# Patient Record
Sex: Female | Born: 1995 | Hispanic: Yes | Smoking: Never smoker
Health system: Southern US, Community
[De-identification: ages and names within clinical notes are randomized; demographics above are authoritative.]

## PROBLEM LIST (undated history)

## (undated) DIAGNOSIS — Z789 Other specified health status: Secondary | ICD-10-CM

## (undated) DIAGNOSIS — D649 Anemia, unspecified: Secondary | ICD-10-CM

---
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
## 2015-07-16 NOTE — L&D Delivery Note (Signed)
Delivery Note  First Stage: Labor onset: 05/14/16 @1800  Augmentation : none Analgesia /Anesthesia intrapartum: none SROM for clear fluid at 0640  Second Stage: Complete dilation at 0644 Onset of pushing at 0644 FHR second stage Category 2: baseline: 145 bpm/ moderate variability/ +accels/ occasional early decel with contractions   Patient progressed quickly from 2.5cm to complete in 2 hours.  Precipitous Delivery of a viable female at [phone] by Carlean JewsMeredith Wilene Pharo, CNM in Direct OA position, anterior shoulder quickly delivered followed by body. Baby was vigorously crying and I dried her and placed her on mom's abdomen.  No nuchal cord Cord double clamped after cessation of pulsation, cut by FOB Cord blood sample collected   Third Stage: Placenta delivered via Tomasa BlaseSchultz intact with 3 VC @ [phone] Placenta disposition: hospital disposal Uterine tone firm with massage and Pitocin infusing / bleeding moderate from laceration, but slowed after repair and Pitocin  1st degree perineal with vaginal extension identified  Anesthesia for repair: 1% Lidocaine 30mL Repair: 2.0 vicryl  Est. Blood Loss (mL): 300mL  Complications: none  Mom to postpartum.  Baby to Couplet care / Skin to Skin.  Newborn: Birth Weight: pending Apgar Scores: 8, 9 Feeding planned: Formula   Carlean JewsMeredith Aleigha Gilani, CNM

## 2016-04-16 ENCOUNTER — Other Ambulatory Visit: Payer: Self-pay | Admitting: Oncology

## 2016-04-16 DIAGNOSIS — O99013 Anemia complicating pregnancy, third trimester: Principal | ICD-10-CM

## 2016-04-16 DIAGNOSIS — D509 Iron deficiency anemia, unspecified: Secondary | ICD-10-CM

## 2016-04-21 NOTE — Progress Notes (Signed)
[facility]  Telephone:(336) [phone] Fax:(336) [phone]  ID: Kerry Rhodes OB: 10/25/1995  MR#: [id number]  GEX#:[id number]  Patient Care Team: No Pcp Per Patient as PCP - General (General Practice)  CHIEF COMPLAINT: Iron deficiency anemia in the third trimester of pregnancy.  INTERVAL HISTORY: Patient is a 20 year old female as noted have a declining hemoglobin and iron stores on routine blood work. She is in her third trimester pregnancy and her due date is May 20, 2016. She feels fatigued, but otherwise feels well. She has no neurological point. She denies any recent fevers or illnesses. She is a good appetite and is gaining weight appropriately. She has no chest pain or shortness of breath. She denies any nausea, vomiting, constipation, or diarrhea. She has no urinary complaints. Patient offers no further specific complaints.   REVIEW OF SYSTEMS:   Review of Systems  Constitutional: Negative.  Negative for fever, malaise/fatigue and weight loss.  Respiratory: Negative.  Negative for cough and shortness of breath.   Cardiovascular: Negative.  Negative for chest pain and leg swelling.  Gastrointestinal: Negative.  Negative for abdominal pain.  Genitourinary: Negative.   Musculoskeletal: Negative.   Neurological: Negative.  Negative for weakness.  Psychiatric/Behavioral: Negative.  The patient is not nervous/anxious.     As per HPI. Otherwise, a complete review of systems is negative.  PAST MEDICAL HISTORY: History reviewed. No pertinent past medical history.  PAST SURGICAL HISTORY: History reviewed. No pertinent surgical history.  FAMILY HISTORY: History reviewed. No pertinent family history.  ADVANCED DIRECTIVES (Y/N):  N  HEALTH MAINTENANCE: Social History  Substance Use Topics  . Smoking status: Never Smoker  . Smokeless tobacco: Never Used  . Alcohol use Not on file     Colonoscopy:  PAP:  Bone density:  Lipid panel:  No Known  Allergies  Current Outpatient Prescriptions  Medication Sig Dispense Refill  . ferrous fumarate (HEMOCYTE - 106 MG FE) 325 (106 Fe) MG TABS tablet Take 1 tablet by mouth 3 (three) times daily.    . Prenatal Vit-Fe Fumarate-FA (PRENATAL VITAMIN PO) Take 1 tablet by mouth daily.     No current facility-administered medications for this visit.     OBJECTIVE: Vitals:   04/23/16 1104  BP: 117/77  Pulse: 74  Resp: 18  Temp: 98.5 F (36.9 C)     Body mass index is 37.1 kg/m.    ECOG FS:0 - Asymptomatic  General: Well-developed, well-nourished, no acute distress. Eyes: Pink conjunctiva, anicteric sclera. HEENT: Normocephalic, moist mucous membranes, clear oropharnyx. Lungs: Clear to auscultation bilaterally. Heart: Regular rate and rhythm. No rubs, murmurs, or gallops. Abdomen: Appears appropriate for gestational age. Musculoskeletal: No edema, cyanosis, or clubbing. Neuro: Alert, answering all questions appropriately. Cranial nerves grossly intact. Skin: No rashes or petechiae noted. Psych: Normal affect. Lymphatics: No cervical, calvicular, axillary or inguinal LAD.   LAB RESULTS:  No results found for: NA, K, CL, CO2, GLUCOSE, BUN, CREATININE, CALCIUM, PROT, ALBUMIN, AST, ALT, ALKPHOS, BILITOT, GFRNONAA, GFRAA  Lab Results  Component Value Date   WBC 8.3 04/23/2016   NEUTROABS 4.8 04/23/2016   HGB 9.7 (L) 04/23/2016   HCT 29.7 (L) 04/23/2016   MCV 71.1 (L) 04/23/2016   PLT 288 04/23/2016   Lab Results  Component Value Date   IRON 371 (H) 04/23/2016   TIBC 572 (H) 04/23/2016   IRONPCTSAT 65 (H) 04/23/2016   Lab Results  Component Value Date   FERRITIN 11 04/23/2016     STUDIES: No results found.  ASSESSMENT: Iron deficiency anemia in the third trimester of pregnancy.  PLAN:    1. Iron deficiency anemia in the third trimester of pregnancy: Patient's hemoglobin is decreased, but improved from previous lab work. Her iron stores are significantly elevated, but  is unclear if she received her iron infusion prior to having her labs drawn. No other intervention is needed at this time. Patient does not require second infusion. Return to clinic in 4 months for repeat laboratory work and further evaluation. If her hemoglobin has not responded appropriately at that time, will consider a full anemia workup.  Pregnancy: Patient is due date is May 20, 2016.  Patient expressed understanding and was in agreement with this plan. She also understands that She can call clinic at any time with any questions, concerns, or complaints.    Jeralyn Ruthsimothy J Jasmine Mcbeth, MD   04/29/2016 8:17 AM

## 2016-04-22 ENCOUNTER — Other Ambulatory Visit: Payer: Self-pay | Admitting: *Deleted

## 2016-04-22 DIAGNOSIS — O99013 Anemia complicating pregnancy, third trimester: Principal | ICD-10-CM

## 2016-04-22 DIAGNOSIS — D509 Iron deficiency anemia, unspecified: Secondary | ICD-10-CM

## 2016-04-23 ENCOUNTER — Other Ambulatory Visit: Payer: Self-pay

## 2016-04-23 ENCOUNTER — Inpatient Hospital Stay: Payer: Medicaid Other | Attending: Oncology

## 2016-04-23 ENCOUNTER — Encounter (INDEPENDENT_AMBULATORY_CARE_PROVIDER_SITE_OTHER): Payer: Self-pay

## 2016-04-23 ENCOUNTER — Inpatient Hospital Stay: Payer: Medicaid Other

## 2016-04-23 ENCOUNTER — Encounter: Payer: Self-pay | Admitting: Oncology

## 2016-04-23 ENCOUNTER — Inpatient Hospital Stay (HOSPITAL_BASED_OUTPATIENT_CLINIC_OR_DEPARTMENT_OTHER): Payer: Medicaid Other | Admitting: Oncology

## 2016-04-23 VITALS — BP 117/77 | HR 74 | Temp 98.5°F | Resp 18 | Ht 62.0 in | Wt 202.8 lb

## 2016-04-23 VITALS — BP 114/75 | HR 62 | Resp 20

## 2016-04-23 DIAGNOSIS — O99013 Anemia complicating pregnancy, third trimester: Secondary | ICD-10-CM | POA: Insufficient documentation

## 2016-04-23 DIAGNOSIS — Z79899 Other long term (current) drug therapy: Secondary | ICD-10-CM

## 2016-04-23 DIAGNOSIS — D509 Iron deficiency anemia, unspecified: Secondary | ICD-10-CM

## 2016-04-23 LAB — CBC WITH DIFFERENTIAL/PLATELET
BASOS PCT: 0 %
Basophils Absolute: 0 10*3/uL (ref 0–0.1)
EOS ABS: 0.1 10*3/uL (ref 0–0.7)
EOS PCT: 1 %
HCT: 29.7 % — ABNORMAL LOW (ref 35.0–47.0)
Hemoglobin: 9.7 g/dL — ABNORMAL LOW (ref 12.0–16.0)
LYMPHS ABS: 2.9 10*3/uL (ref 1.0–3.6)
Lymphocytes Relative: 35 %
MCH: 23.1 pg — AB (ref 26.0–34.0)
MCHC: 32.5 g/dL (ref 32.0–36.0)
MCV: 71.1 fL — ABNORMAL LOW (ref 80.0–100.0)
MONO ABS: 0.5 10*3/uL (ref 0.2–0.9)
MONOS PCT: 7 %
Neutro Abs: 4.8 10*3/uL (ref 1.4–6.5)
Neutrophils Relative %: 57 %
Platelets: 288 10*3/uL (ref 150–440)
RBC: 4.18 MIL/uL (ref 3.80–5.20)
RDW: 19.4 % — AB (ref 11.5–14.5)
WBC: 8.3 10*3/uL (ref 3.6–11.0)

## 2016-04-23 LAB — FERRITIN: FERRITIN: 11 ng/mL (ref 11–307)

## 2016-04-23 LAB — IRON AND TIBC
IRON: 371 ug/dL — AB (ref 28–170)
Saturation Ratios: 65 % — ABNORMAL HIGH (ref 10.4–31.8)
TIBC: 572 ug/dL — ABNORMAL HIGH (ref 250–450)
UIBC: 201 ug/dL

## 2016-04-23 MED ORDER — SODIUM CHLORIDE 0.9 % IV SOLN
Freq: Once | INTRAVENOUS | Status: AC
  Administered 2016-04-23: 20 mL/h via INTRAVENOUS
  Filled 2016-04-23: qty 1000

## 2016-04-23 MED ORDER — FERUMOXYTOL INJECTION 510 MG/17 ML
510.0000 mg | Freq: Once | INTRAVENOUS | Status: AC
  Administered 2016-04-23: 510 mg via INTRAVENOUS
  Filled 2016-04-23: qty 17

## 2016-04-23 NOTE — Progress Notes (Signed)
Iron levels normal/high.  Confirmed with MD, wants patient to have One dose of feraheme today.

## 2016-04-23 NOTE — Progress Notes (Signed)
New evaluation for IDA. Offers no complaints. 

## 2016-04-30 ENCOUNTER — Inpatient Hospital Stay: Payer: Medicaid Other

## 2016-05-15 ENCOUNTER — Inpatient Hospital Stay
Admission: EM | Admit: 2016-05-15 | Discharge: 2016-05-16 | DRG: 775 | Disposition: A | Discharge status: Home / Self Care | Payer: Medicaid Other | Attending: Obstetrics and Gynecology | Admitting: Obstetrics and Gynecology

## 2016-05-15 DIAGNOSIS — O9902 Anemia complicating childbirth: Secondary | ICD-10-CM | POA: Diagnosis present

## 2016-05-15 DIAGNOSIS — Z3A39 39 weeks gestation of pregnancy: Secondary | ICD-10-CM

## 2016-05-15 DIAGNOSIS — O99214 Obesity complicating childbirth: Secondary | ICD-10-CM | POA: Diagnosis present

## 2016-05-15 DIAGNOSIS — D509 Iron deficiency anemia, unspecified: Secondary | ICD-10-CM | POA: Diagnosis present

## 2016-05-15 DIAGNOSIS — E669 Obesity, unspecified: Secondary | ICD-10-CM | POA: Diagnosis present

## 2016-05-15 DIAGNOSIS — Z3483 Encounter for supervision of other normal pregnancy, third trimester: Secondary | ICD-10-CM | POA: Diagnosis present

## 2016-05-15 HISTORY — DX: Other specified health status: Z78.9

## 2016-05-15 HISTORY — DX: Anemia, unspecified: D64.9

## 2016-05-15 LAB — CBC
HCT: 35.4 % (ref 35.0–47.0)
Hemoglobin: 11.7 g/dL — ABNORMAL LOW (ref 12.0–16.0)
MCH: 25.6 pg — AB (ref 26.0–34.0)
MCHC: 33 g/dL (ref 32.0–36.0)
MCV: 77.5 fL — ABNORMAL LOW (ref 80.0–100.0)
PLATELETS: 292 10*3/uL (ref 150–440)
RBC: 4.56 MIL/uL (ref 3.80–5.20)
RDW: 27.4 % — AB (ref 11.5–14.5)
WBC: 10 10*3/uL (ref 3.6–11.0)

## 2016-05-15 LAB — CHLAMYDIA/NGC RT PCR (ARMC ONLY)
Chlamydia Tr: NOT DETECTED
N gonorrhoeae: NOT DETECTED

## 2016-05-15 LAB — TYPE AND SCREEN
ABO/RH(D): O POS
Antibody Screen: NEGATIVE

## 2016-05-15 MED ORDER — MISOPROSTOL 200 MCG PO TABS
ORAL_TABLET | ORAL | Status: AC
  Filled 2016-05-15: qty 4

## 2016-05-15 MED ORDER — ONDANSETRON HCL 4 MG/2ML IJ SOLN: 4.0000 mg | Freq: Four times a day (QID) | INTRAMUSCULAR | Status: DC | PRN

## 2016-05-15 MED ORDER — PRENATAL MULTIVITAMIN CH
1.0000 | ORAL_TABLET | Freq: Every day | ORAL | Status: DC
  Administered 2016-05-15 – 2016-05-16 (×2): 1 via ORAL
  Filled 2016-05-15 (×2): qty 1

## 2016-05-15 MED ORDER — LIDOCAINE HCL (PF) 1 % IJ SOLN: 30.0000 mL | INTRAMUSCULAR | Status: DC | PRN

## 2016-05-15 MED ORDER — ONDANSETRON HCL 4 MG PO TABS: 4.0000 mg | ORAL_TABLET | ORAL | Status: DC | PRN

## 2016-05-15 MED ORDER — OXYCODONE-ACETAMINOPHEN 5-325 MG PO TABS: 1.0000 | ORAL_TABLET | ORAL | Status: DC | PRN

## 2016-05-15 MED ORDER — WITCH HAZEL-GLYCERIN EX PADS
1.0000 | MEDICATED_PAD | CUTANEOUS | Status: DC | PRN
Start: 2016-05-15 — End: 2016-05-16

## 2016-05-15 MED ORDER — AMMONIA AROMATIC IN INHA
RESPIRATORY_TRACT | Status: AC
  Filled 2016-05-15: qty 10

## 2016-05-15 MED ORDER — SOD CITRATE-CITRIC ACID 500-334 MG/5ML PO SOLN
30.0000 mL | ORAL | Status: DC | PRN
  Filled 2016-05-15: qty 30

## 2016-05-15 MED ORDER — OXYTOCIN BOLUS FROM INFUSION: 500.0000 mL | Freq: Once | INTRAVENOUS | Status: DC

## 2016-05-15 MED ORDER — IBUPROFEN 600 MG PO TABS
600.0000 mg | ORAL_TABLET | Freq: Four times a day (QID) | ORAL | Status: DC
Start: 2016-05-15 — End: 2016-05-16
  Administered 2016-05-15 – 2016-05-16 (×5): 600 mg via ORAL
  Filled 2016-05-15 (×5): qty 1

## 2016-05-15 MED ORDER — ACETAMINOPHEN 325 MG PO TABS: 650.0000 mg | ORAL_TABLET | ORAL | Status: DC | PRN

## 2016-05-15 MED ORDER — LIDOCAINE HCL (PF) 1 % IJ SOLN
INTRAMUSCULAR | Status: AC
  Filled 2016-05-15: qty 30

## 2016-05-15 MED ORDER — COCONUT OIL OIL: 1.0000 "application " | TOPICAL_OIL | Status: DC | PRN

## 2016-05-15 MED ORDER — FERROUS SULFATE 325 (65 FE) MG PO TABS
325.0000 mg | ORAL_TABLET | Freq: Two times a day (BID) | ORAL | Status: DC
  Administered 2016-05-15 – 2016-05-16 (×2): 325 mg via ORAL
  Filled 2016-05-15 (×2): qty 1

## 2016-05-15 MED ORDER — DIBUCAINE 1 % RE OINT: 1.0000 "application " | TOPICAL_OINTMENT | RECTAL | Status: DC | PRN

## 2016-05-15 MED ORDER — ZOLPIDEM TARTRATE 5 MG PO TABS
5.0000 mg | ORAL_TABLET | Freq: Every evening | ORAL | Status: DC | PRN
Start: 2016-05-15 — End: 2016-05-16

## 2016-05-15 MED ORDER — LACTATED RINGERS IV SOLN: 500.0000 mL | INTRAVENOUS | Status: DC | PRN

## 2016-05-15 MED ORDER — SIMETHICONE 80 MG PO CHEW: 80.0000 mg | CHEWABLE_TABLET | ORAL | Status: DC | PRN

## 2016-05-15 MED ORDER — OXYTOCIN 10 UNIT/ML IJ SOLN
INTRAMUSCULAR | Status: AC
  Filled 2016-05-15: qty 2

## 2016-05-15 MED ORDER — OXYCODONE-ACETAMINOPHEN 5-325 MG PO TABS: 2.0000 | ORAL_TABLET | ORAL | Status: DC | PRN

## 2016-05-15 MED ORDER — LACTATED RINGERS IV SOLN: INTRAVENOUS | Status: DC

## 2016-05-15 MED ORDER — OXYTOCIN 40 UNITS IN LACTATED RINGERS INFUSION - SIMPLE MED
2.5000 [IU]/h | INTRAVENOUS | Status: DC
  Administered 2016-05-15: 2.5 [IU]/h via INTRAVENOUS
  Filled 2016-05-15: qty 1000

## 2016-05-15 MED ORDER — FLEET ENEMA 7-19 GM/118ML RE ENEM: 1.0000 | ENEMA | RECTAL | Status: DC | PRN

## 2016-05-15 MED ORDER — DIPHENHYDRAMINE HCL 25 MG PO CAPS: 25.0000 mg | ORAL_CAPSULE | Freq: Four times a day (QID) | ORAL | Status: DC | PRN

## 2016-05-15 MED ORDER — BUTORPHANOL TARTRATE 1 MG/ML IJ SOLN: 1.0000 mg | INTRAMUSCULAR | Status: DC | PRN

## 2016-05-15 MED ORDER — SENNOSIDES-DOCUSATE SODIUM 8.6-50 MG PO TABS
2.0000 | ORAL_TABLET | ORAL | Status: DC
  Administered 2016-05-15: 2 via ORAL
  Filled 2016-05-15: qty 2

## 2016-05-15 MED ORDER — ONDANSETRON HCL 4 MG/2ML IJ SOLN: 4.0000 mg | INTRAMUSCULAR | Status: DC | PRN

## 2016-05-15 MED ORDER — BENZOCAINE-MENTHOL 20-0.5 % EX AERO
1.0000 "application " | INHALATION_SPRAY | Freq: Three times a day (TID) | CUTANEOUS | Status: DC
  Administered 2016-05-15: 1 via TOPICAL
  Filled 2016-05-15: qty 56

## 2016-05-15 NOTE — Discharge Summary (Signed)
  Obstetric Discharge Summary   Patient ID: Kerry Rhodes MRN: [id number] DOB/AGE: 01/15/1996 20 y.o.   Date of Admission: 05/15/2016  Date of Discharge:   Admitting Diagnosis: Onset of Labor at 2443w1d  Secondary Diagnosis: Anemia in pregnancy and Obesity  Mode of Delivery: normal spontaneous precipitous  vaginal delivery on 05/15/16     Discharge Diagnosis: Postpartum care following vaginal delivery,1st degree perineal laceration with vaginal extension   Intrapartum Procedures: 1st degree laceration with vaginal extension   Post partum procedures: 1st degree laceration with vaginal extension  Complications: none   Brief Hospital Course  Kerry Rhodes is a G2P2001 who had a SVD on 05/15/16;  for further details of this delivery, please refer to the delivery note.  Patient had an uncomplicated postpartum course.  By time of discharge on PPD#2, her pain was controlled on oral pain medications; she had appropriate lochia and was ambulating, voiding without difficulty and tolerating regular diet.  She was deemed stable for discharge to home.    Labs: CBC Latest Ref Rng & Units 05/15/2016 04/23/2016  WBC 3.6 - 11.0 K/uL 10.0 8.3  Hemoglobin 12.0 - 16.0 g/dL 11.7(L) 9.7(L)  Hematocrit 35.0 - 47.0 % 35.4 29.7(L)  Platelets 150 - 440 K/uL 292 288   O POS  Physical exam:  Blood pressure (!) 108/53, pulse 66, temperature 98.5 F (36.9 C), temperature source Oral, resp. rate 18, height 5\' 2"  (1.575 m), weight 93 kg (205 lb), unknown if currently breastfeeding. General: alert and no distress Lochia: appropriate Abdomen: soft, NT Uterine Fundus: firm Perineum: healing well, no significant drainage, no dehiscence, no significant erythema Extremities: No evidence of DVT seen on physical exam. No lower extremity edema.  Discharge Instructions: Per After Visit Summary. Activity: Advance as tolerated. Pelvic rest for 6 weeks.  Also refer to After Visit Summary Diet: Regular meds  : motrin , dermaplast  Outpatient follow up:  Follow-up Information    [facility]. Schedule an appointment as soon as possible for a visit in 6 week(s).   Why:  6 week postpartum visit  Contact information: [address] [place] [phone]          Postpartum contraception:   Discharged Condition: good  Discharged to: home   Newborn Data:  Baby Girl  Disposition:home with mother  Apgars: APGAR (1 MIN): 8   APGAR (5 MINS): [address]9     Baby Feeding: Bottle  Karena AddisonSigmon, Meredith C, CNM 05/15/2016

## 2016-05-15 NOTE — H&P (Signed)
  OB ADMISSION/ HISTORY & PHYSICAL:  Admission Date: 05/15/2016  3:20 AM  Admit Diagnosis: Active labor at 39+1 weeks  Kerry Rhodes is a 20 y.o. female G2P1 at 39+1 weeks presenting for strong labor contractions that started around 1800 yesterday and have progressively worsened around 0330.    Prenatal History: G2P1001   EDC : 05/21/2016, by LMP 08/14/15 Prenatal care at [facility] Prenatal course complicated by obesity, IDA with IV iron infusions,   Prenatal Labs: ABO, Rh: --/--/O POS (11/01 16100743) Antibody: NEG (11/01 0743) Rubella:  Immune Varicella: Immune  RPR:   NR HBsAg:   Negative HIV:   Negative GTT: 113 GBS:   Negative   Medical / Surgical History :  Past medical history:  Past Medical History:  Diagnosis Date  . Anemia    during 3rd trimester   . Medical history non-contributory      Past surgical history: No past surgical history on file.  Family History: No family history on file.   Social History:  reports that she has never smoked. She has never used smokeless tobacco. Her alcohol and drug histories are not on file.   Allergies: Review of patient's allergies indicates no known allergies.    Current Medications at time of admission:  Prior to Admission medications   Medication Sig Start Date End Date Taking? Authorizing Provider  ferrous fumarate (HEMOCYTE - 106 MG FE) 325 (106 Fe) MG TABS tablet Take 1 tablet by mouth 3 (three) times daily.   Yes Historical Provider, MD  Prenatal Vit-Fe Fumarate-FA (PRENATAL VITAMIN PO) Take 1 tablet by mouth daily.   Yes Historical Provider, MD     Review of Systems: Active FM onset of ctx @ 1800 currently every 2-3 minutes No LOF  / SROM  No bloody show   Physical Exam:  VS: Blood pressure 126/78, pulse 60, temperature 98.2 F (36.8 C), temperature source Oral, resp. rate 16.  General: alert and oriented, appears in labor Heart: RRR Lungs: Clear lung fields Abdomen: Gravid, soft and non-tender,  non-distended / uterus: gravid, non-tender Extremities: no edema  Genitalia / VE: Dilation: 5 Effacement (%): 60 Station: -3 Exam by:: E. Leticia Clasivera, RN  FHR: baseline rate 140 bpm/ variability moderate / accelerations + / no decelerations TOCO: 2-3 minutes  Assessment: 39+[redacted] weeks gestation Active stage of labor FHR category 1   Plan:  1. Admit to [facility] Place      - Routine labor and delivery orders     - Stadol 1mg  every 1 hour PRN 2. GBS negative 3. Postpartum     - Formula feeding 4. Anticipate NSVD  Dr. Feliberto Rhodes notified of admission / plan of care  [facility], CNM

## 2016-05-15 NOTE — Progress Notes (Addendum)
Post Partum Day 1 Subjective: Doing ok. Bottle feeding infant.   Objective: Blood pressure 130/74, pulse (!) 58, temperature 98.2 F (36.8 C), resp. rate 18, height 5\' 2"  (1.575 m), weight 93 kg (205 lb), unknown if currently breastfeeding.  Physical Exam:  General: A,A&O x 3 Lochia: mod, no clots.  Uterine Fundus: U-2, FF.  Perineum: intact, no hematoma DVT Evaluation: Neg Homan's    Recent Labs  05/15/16 0743  HGB 11.7*  HCT 35.4    Assessment/Plan: A: 1. PPD#1 stable 2. Anemia P: Condoms 2. DC in am 3. OTC Fe replacement   LOS: 0 days   Sharee PimpleCaron W Jones 05/15/2016, 9:58 AM

## 2016-05-15 NOTE — OB Triage Note (Addendum)
Patient came in today c/o contractions that began yesterday day. Starting at 1800 contractions were constant at every 5-7 mins per patient. Rating contraction pain 7 out of 10. Reports postive fetal movement, denies vaginal discharge. Reports spot bleeding when wiping after urination. Patient currently on the monitor.

## 2016-05-15 NOTE — Progress Notes (Signed)
[facility] for post delivery void. Steady gait.  Pt. Voided large amount of red tinge urine; pericare given, clean peripad applied. [facility] Nursery nurse at the bedside to transfer infant to postpartum via bassinet. Stable patient.

## 2016-05-16 LAB — CBC
HCT: 33.3 % — ABNORMAL LOW (ref 35.0–47.0)
Hemoglobin: 11 g/dL — ABNORMAL LOW (ref 12.0–16.0)
MCH: 25.6 pg — AB (ref 26.0–34.0)
MCHC: 33.2 g/dL (ref 32.0–36.0)
MCV: 77.2 fL — AB (ref 80.0–100.0)
PLATELETS: 274 10*3/uL (ref 150–440)
RBC: 4.31 MIL/uL (ref 3.80–5.20)
RDW: 27.5 % — AB (ref 11.5–14.5)
WBC: 10 10*3/uL (ref 3.6–11.0)

## 2016-05-16 LAB — RPR: RPR: NONREACTIVE

## 2016-05-16 MED ORDER — BENZOCAINE-MENTHOL 20-0.5 % EX AERO: 1.0000 "application " | INHALATION_SPRAY | Freq: Three times a day (TID) | CUTANEOUS | 1 refills | Status: AC

## 2016-05-16 MED ORDER — IBUPROFEN 600 MG PO TABS: 600.0000 mg | ORAL_TABLET | Freq: Four times a day (QID) | ORAL | 0 refills | Status: AC

## 2016-05-16 NOTE — Progress Notes (Signed)
D/C order from MD.  Reviewed d/c instructions and prescriptions with patient and answered any questions.  Patient d/c home with infant via wheelchair by nursing/auxillary. 

## 2016-05-16 NOTE — Discharge Instructions (Signed)
Please call your doctor or return to the ER if you experience any chest pains, shortness of breath, fever greater than 101, any heavy bleeding or large clots, and foul smelling vaginal discharge, any worsening abdominal pain & cramping that is not controlled by pain medication, or any signs of post partum depression.  No tampons, enemas, douches, or sexual intercourse for 6 weeks.  Also avoid tub baths, hot tubs, or swimming for 6 weeks. ° °Care After Vaginal Delivery °Congratulations on your new baby!! ° °Refer to this sheet in the next few weeks. These discharge instructions provide you with information on caring for yourself after delivery. Your caregiver may also give you specific instructions. Your treatment has been planned according to the most current medical practices available, but problems sometimes occur. Call your caregiver if you have any problems or questions after you go home. ° °HOME CARE INSTRUCTIONS °· Take over-the-counter or prescription medicines only as directed by your caregiver or pharmacist. °· Do not drink alcohol, especially if you are breastfeeding or taking medicine to relieve pain. °· Do not chew or smoke tobacco. °· Do not use illegal drugs. °· Continue to use good perineal care. Good perineal care includes: °¨ Wiping your perineum from front to back. °¨ Keeping your perineum clean. °· Do not use tampons or douche until your caregiver says it is okay. °· Shower, wash your hair, and take tub baths as directed by your caregiver. °· Wear a well-fitting bra that provides breast support. °· Eat healthy foods. °· Drink enough fluids to keep your urine clear or pale yellow. °· Eat high-fiber foods such as whole grain cereals and breads, brown rice, beans, and fresh fruits and vegetables every day. These foods may help prevent or relieve constipation. °· Follow your caregiver's recommendations regarding resumption of activities such as climbing stairs, driving, lifting, exercising, or  traveling. Specifically, no driving for two weeks, so that you are comfortable reacting quickly in an emergency. °· Talk to your caregiver about resuming sexual activities. Resumption of sexual activities is dependent upon your risk of infection, your rate of healing, and your comfort and desire to resume sexual activity. Usually we recommend waiting about six weeks, or until your bleeding stops and you are interested in sex. °· Try to have someone help you with your household activities and your newborn for at least a few days after you leave the hospital. Even longer is better. °· Rest as much as possible. Try to rest or take a nap when your newborn is sleeping. Sleep deprivation can be very hard after delivery. °· Increase your activities gradually. °· Keep all of your scheduled postpartum appointments. It is very important to keep your scheduled follow-up appointments. At these appointments, your caregiver will be checking to make sure that you are healing physically and emotionally. ° °SEEK MEDICAL CARE IF:  °· You are passing large clots from your vagina.  °· You have a foul smelling discharge from your vagina. °· You have trouble urinating. °· You are urinating frequently. °· You have pain when you urinate. °· You have a change in your bowel movements. °· You have increasing redness, pain, or swelling near your vaginal incision (episiotomy) or vaginal tear. °· You have pus draining from your episiotomy or vaginal tear. °· Your episiotomy or vaginal tear is separating. °· You have painful, hard, or reddened breasts. °· You have a severe headache. °· You have blurred vision or see spots. °· You feel sad or depressed. °· You   have thoughts of hurting yourself or your newborn. °· You have questions about your care, the care of your newborn, or medicines. °· You are dizzy or light-headed. °· You have a rash. °· You have nausea or vomiting. °· You were breastfeeding and have not had a menstrual period within 12  weeks after you stopped breastfeeding. °· You are not breastfeeding and have not had a menstrual period by the 12th week after delivery. °· You have a fever. ° °SEEK IMMEDIATE MEDICAL CARE IF:  °· You have persistent pain. °· You have chest pain. °· You have shortness of breath. °· You faint. °· You have leg pain. °· You have stomach pain. °· Your vaginal bleeding saturates two or more sanitary pads in 1 hour. ° °MAKE SURE YOU:  °· Understand these instructions. °· Will get help right away if you are not doing well or get worse. °·  °Document Released: 06/28/2000 Document Revised: 11/15/2013 Document Reviewed: 02/26/2012 ° °ExitCare® Patient Information ©2015 [employer]. This information is not intended to replace advice given to you by your health care provider. Make sure you discuss any questions you have with your health care provider. ° °

## 2016-08-25 NOTE — Progress Notes (Deleted)
[facility]  Telephone:(336) [phone] Fax:(336) [phone]  ID: Kerry Rhodes OB: July 21, 1995  MR#: [id number]  AOZ#:[id number]#:[id number]  Patient Care Team: No Pcp Per Patient as PCP - General (General Practice)  CHIEF COMPLAINT: Iron deficiency anemia.  INTERVAL HISTORY: Patient is a 21 year old female as noted have a declining hemoglobin and iron stores on routine blood work. She is in her third trimester pregnancy and her due date is May 20, 2016. She feels fatigued, but otherwise feels well. She has no neurological point. She denies any recent fevers or illnesses. She is a good appetite and is gaining weight appropriately. She has no chest pain or shortness of breath. She denies any nausea, vomiting, constipation, or diarrhea. She has no urinary complaints. Patient offers no further specific complaints.   REVIEW OF SYSTEMS:   Review of Systems  Constitutional: Negative.  Negative for fever, malaise/fatigue and weight loss.  Respiratory: Negative.  Negative for cough and shortness of breath.   Cardiovascular: Negative.  Negative for chest pain and leg swelling.  Gastrointestinal: Negative.  Negative for abdominal pain.  Genitourinary: Negative.   Musculoskeletal: Negative.   Neurological: Negative.  Negative for weakness.  Psychiatric/Behavioral: Negative.  The patient is not nervous/anxious.     As per HPI. Otherwise, a complete review of systems is negative.  PAST MEDICAL HISTORY: Past Medical History:  Diagnosis Date  . Anemia    during 3rd trimester   . Medical history non-contributory     PAST SURGICAL HISTORY: No past surgical history on file.  FAMILY HISTORY: No family history on file.  ADVANCED DIRECTIVES (Y/N):  N  HEALTH MAINTENANCE: Social History  Substance Use Topics  . Smoking status: Never Smoker  . Smokeless tobacco: Never Used  . Alcohol use Not on file     Colonoscopy:  PAP:  Bone density:  Lipid panel:  No Known  Allergies  Current Outpatient Prescriptions  Medication Sig Dispense Refill  . benzocaine-Menthol (DERMOPLAST) 20-0.5 % AERO Apply 1 application topically 3 (three) times daily. 1 each 1  . ferrous fumarate (HEMOCYTE - 106 MG FE) 325 (106 Fe) MG TABS tablet Take 1 tablet by mouth 3 (three) times daily.    Marland Kitchen. ibuprofen (ADVIL,MOTRIN) 600 MG tablet Take 1 tablet (600 mg total) by mouth every 6 (six) hours. 30 tablet 0  . Prenatal Vit-Fe Fumarate-FA (PRENATAL VITAMIN PO) Take 1 tablet by mouth daily.     No current facility-administered medications for this visit.     OBJECTIVE: There were no vitals filed for this visit.   There is no height or weight on file to calculate BMI.    ECOG FS:0 - Asymptomatic  General: Well-developed, well-nourished, no acute distress. Eyes: Pink conjunctiva, anicteric sclera. HEENT: Normocephalic, moist mucous membranes, clear oropharnyx. Lungs: Clear to auscultation bilaterally. Heart: Regular rate and rhythm. No rubs, murmurs, or gallops. Abdomen: Appears appropriate for gestational age. Musculoskeletal: No edema, cyanosis, or clubbing. Neuro: Alert, answering all questions appropriately. Cranial nerves grossly intact. Skin: No rashes or petechiae noted. Psych: Normal affect. Lymphatics: No cervical, calvicular, axillary or inguinal LAD.   LAB RESULTS:  No results found for: NA, K, CL, CO2, GLUCOSE, BUN, CREATININE, CALCIUM, PROT, ALBUMIN, AST, ALT, ALKPHOS, BILITOT, GFRNONAA, GFRAA  Lab Results  Component Value Date   WBC 10.0 05/16/2016   NEUTROABS 4.8 04/23/2016   HGB 11.0 (L) 05/16/2016   HCT 33.3 (L) 05/16/2016   MCV 77.2 (L) 05/16/2016   PLT 274 05/16/2016   Lab Results  Component Value  Date   IRON 371 (H) 04/23/2016   TIBC 572 (H) 04/23/2016   IRONPCTSAT 65 (H) 04/23/2016   Lab Results  Component Value Date   FERRITIN 11 04/23/2016     STUDIES: No results found.  ASSESSMENT: Iron deficiency anemia.  PLAN:    1. Iron  deficiency anemia: Patient's hemoglobin is decreased, but improved from previous lab work. Her iron stores are significantly elevated, but is unclear if she received her iron infusion prior to having her labs drawn. No other intervention is needed at this time. Patient does not require second infusion. Return to clinic in 4 months for repeat laboratory work and further evaluation. If her hemoglobin has not responded appropriately at that time, will consider a full anemia workup.  Pregnancy: Patient is due date is May 20, 2016.  Patient expressed understanding and was in agreement with this plan. She also understands that She can call clinic at any time with any questions, concerns, or complaints.    Jeralyn Ruths, MD   08/25/2016 10:39 PM

## 2016-08-26 ENCOUNTER — Inpatient Hospital Stay: Payer: Medicaid Other

## 2016-08-26 ENCOUNTER — Inpatient Hospital Stay: Payer: Medicaid Other | Admitting: Oncology

## 2016-09-22 NOTE — Progress Notes (Deleted)
[facility]  [phone] [phone]  ID: Kerry Rhodes OB: 1995/11/24  MR#: [id number]  AOZ#:[id number]  Patient Care Team: No Pcp Per Patient as PCP - General (General Practice)  CHIEF COMPLAINT: Iron deficiency anemia.  INTERVAL HISTORY: Patient is a 21 year old female as noted have a declining hemoglobin and iron stores on routine blood work. She is in her third trimester pregnancy and her due date is May 20, 2016. She feels fatigued, but otherwise feels well. She has no neurological point. She denies any recent fevers or illnesses. She is a good appetite and is gaining weight appropriately. She has no chest pain or shortness of breath. She denies any nausea, vomiting, constipation, or diarrhea. She has no urinary complaints. Patient offers no further specific complaints.   REVIEW OF SYSTEMS:   Review of Systems  Constitutional: Negative.  Negative for fever, malaise/fatigue and weight loss.  Respiratory: Negative.  Negative for cough and shortness of breath.   Cardiovascular: Negative.  Negative for chest pain and leg swelling.  Gastrointestinal: Negative.  Negative for abdominal pain.  Genitourinary: Negative.   Musculoskeletal: Negative.   Neurological: Negative.  Negative for weakness.  Psychiatric/Behavioral: Negative.  The patient is not nervous/anxious.     As per HPI. Otherwise, a complete review of systems is negative.  PAST MEDICAL HISTORY: Past Medical History:  Diagnosis Date  . Anemia    during 3rd trimester   . Medical history non-contributory     PAST SURGICAL HISTORY: No past surgical history on file.  FAMILY HISTORY: No family history on file.  ADVANCED DIRECTIVES (Y/N):  N  HEALTH MAINTENANCE: Social History  Substance Use Topics  . Smoking status: Never Smoker  . Smokeless tobacco: Never Used  . Alcohol use Not on file     Colonoscopy:  PAP:  Bone density:  Lipid panel:  No Known  Allergies  Current Outpatient Prescriptions  Medication Sig Dispense Refill  . benzocaine-Menthol (DERMOPLAST) 20-0.5 % AERO Apply 1 application topically 3 (three) times daily. 1 each 1  . ferrous fumarate (HEMOCYTE - 106 MG FE) 325 (106 Fe) MG TABS tablet Take 1 tablet by mouth 3 (three) times daily.    Marland Kitchen ibuprofen (ADVIL,MOTRIN) 600 MG tablet Take 1 tablet (600 mg total) by mouth every 6 (six) hours. 30 tablet 0  . Prenatal Vit-Fe Fumarate-FA (PRENATAL VITAMIN PO) Take 1 tablet by mouth daily.     No current facility-administered medications for this visit.     OBJECTIVE: There were no vitals filed for this visit.   There is no height or weight on file to calculate BMI.    ECOG FS:0 - Asymptomatic  General: Well-developed, well-nourished, no acute distress. Eyes: Pink conjunctiva, anicteric sclera. HEENT: Normocephalic, moist mucous membranes, clear oropharnyx. Lungs: Clear to auscultation bilaterally. Heart: Regular rate and rhythm. No rubs, murmurs, or gallops. Abdomen: Appears appropriate for gestational age. Musculoskeletal: No edema, cyanosis, or clubbing. Neuro: Alert, answering all questions appropriately. Cranial nerves grossly intact. Skin: No rashes or petechiae noted. Psych: Normal affect. Lymphatics: No cervical, calvicular, axillary or inguinal LAD.   LAB RESULTS:  No results found for: NA, K, CL, CO2, GLUCOSE, BUN, CREATININE, CALCIUM, PROT, ALBUMIN, AST, ALT, ALKPHOS, BILITOT, GFRNONAA, GFRAA  Lab Results  Component Value Date   WBC 10.0 05/16/2016   NEUTROABS 4.8 04/23/2016   HGB 11.0 (L) 05/16/2016   HCT 33.3 (L) 05/16/2016   MCV 77.2 (L) 05/16/2016   PLT 274 05/16/2016   Lab Results  Component Value  Date   IRON 371 (H) 04/23/2016   TIBC 572 (H) 04/23/2016   IRONPCTSAT 65 (H) 04/23/2016   Lab Results  Component Value Date   FERRITIN 11 04/23/2016     STUDIES: No results found.  ASSESSMENT: Iron deficiency anemia.  PLAN:    1. Iron  deficiency anemia: Patient's hemoglobin is decreased, but improved from previous lab work. Her iron stores are significantly elevated, but is unclear if she received her iron infusion prior to having her labs drawn. No other intervention is needed at this time. Patient does not require second infusion. Return to clinic in 4 months for repeat laboratory work and further evaluation. If her hemoglobin has not responded appropriately at that time, will consider a full anemia workup.  Pregnancy: Patient is due date is May 20, 2016.  Patient expressed understanding and was in agreement with this plan. She also understands that She can call clinic at any time with any questions, concerns, or complaints.    Jeralyn Ruthsimothy J Jermie Hippe, MD   09/22/2016 10:58 PM

## 2016-09-23 ENCOUNTER — Inpatient Hospital Stay: Payer: Medicaid Other

## 2016-09-23 ENCOUNTER — Inpatient Hospital Stay: Payer: Medicaid Other | Admitting: Oncology

## 2020-12-06 ENCOUNTER — Emergency Department
Admission: EM | Admit: 2020-12-06 | Discharge: 2020-12-06 | Disposition: A | Discharge status: Home / Self Care | Payer: Self-pay | Attending: Emergency Medicine | Admitting: Emergency Medicine

## 2020-12-06 ENCOUNTER — Other Ambulatory Visit: Payer: Self-pay

## 2020-12-06 ENCOUNTER — Emergency Department: Payer: Self-pay

## 2020-12-06 DIAGNOSIS — R200 Anesthesia of skin: Secondary | ICD-10-CM

## 2020-12-06 DIAGNOSIS — D649 Anemia, unspecified: Secondary | ICD-10-CM | POA: Insufficient documentation

## 2020-12-06 DIAGNOSIS — R202 Paresthesia of skin: Secondary | ICD-10-CM | POA: Insufficient documentation

## 2020-12-06 LAB — BASIC METABOLIC PANEL
Anion gap: 8 (ref 5–15)
BUN: 7 mg/dL (ref 6–20)
CO2: 24 mmol/L (ref 22–32)
Calcium: 8.7 mg/dL — ABNORMAL LOW (ref 8.9–10.3)
Chloride: 105 mmol/L (ref 98–111)
Creatinine, Ser: 0.48 mg/dL (ref 0.44–1.00)
GFR, Estimated: >60 mL/min (ref >60)
Glucose, Bld: 111 mg/dL — ABNORMAL HIGH (ref 70–99)
Potassium: 3.3 mmol/L — ABNORMAL LOW (ref 3.5–5.1)
Sodium: 137 mmol/L (ref 135–145)

## 2020-12-06 LAB — CBC WITH DIFFERENTIAL/PLATELET
Abs Immature Granulocytes: 0.02 10*3/uL (ref 0.00–0.07)
Basophils Absolute: 0 10*3/uL (ref 0.0–0.1)
Basophils Relative: 0 %
Eosinophils Absolute: 0.2 10*3/uL (ref 0.0–0.5)
Eosinophils Relative: 2 %
HCT: 28.2 % — ABNORMAL LOW (ref 36.0–46.0)
Hemoglobin: 7.8 g/dL — ABNORMAL LOW (ref 12.0–15.0)
Immature Granulocytes: 0 %
Lymphocytes Relative: 44 %
Lymphs Abs: 4 10*3/uL (ref 0.7–4.0)
MCH: 17.9 pg — ABNORMAL LOW (ref 26.0–34.0)
MCHC: 27.7 g/dL — ABNORMAL LOW (ref 30.0–36.0)
MCV: 64.8 fL — ABNORMAL LOW (ref 80.0–100.0)
Monocytes Absolute: 0.5 10*3/uL (ref 0.1–1.0)
Monocytes Relative: 6 %
Neutro Abs: 4.3 10*3/uL (ref 1.7–7.7)
Neutrophils Relative %: 48 %
Platelets: 492 10*3/uL — ABNORMAL HIGH (ref 150–400)
RBC: 4.35 MIL/uL (ref 3.87–5.11)
RDW: 18.9 % — ABNORMAL HIGH (ref 11.5–15.5)
WBC: 9 10*3/uL (ref 4.0–10.5)
nRBC: 0 % (ref 0.0–0.2)

## 2020-12-06 LAB — POC URINE PREG, ED: Preg Test, Ur: NEGATIVE

## 2020-12-06 MED ORDER — FERROUS SULFATE 325 (65 FE) MG PO TABS: 325.0000 mg | ORAL_TABLET | Freq: Every day | ORAL | 3 refills | Status: AC

## 2020-12-06 MED ORDER — POTASSIUM CHLORIDE CRYS ER 20 MEQ PO TBCR
40.0000 meq | EXTENDED_RELEASE_TABLET | Freq: Once | ORAL | Status: AC
  Administered 2020-12-06: 40 meq via ORAL
  Filled 2020-12-06: qty 2

## 2020-12-06 NOTE — Discharge Instructions (Addendum)
Steps to find a Primary Care Provider (PCP):  Call [phone] or [phone] to access "[facility] Find a Doctor Service."  2.  You may also go on the [facility] website at [email or web address]  

## 2020-12-06 NOTE — ED Provider Notes (Signed)
Emergency Medicine Provider Triage Evaluation Note  Kerry Rhodes , a 25 y.o. female  was evaluated in triage.  Pt complains of right arm numbness, difficulty speaking that started at 2 AM.  Review of Systems  Positive: Right arm numbness and difficulty speaking that have resolved Negative: Fevers, cough, chest pain, shortness of breath, nausea, vomiting, diarrhea, weakness, head injury, headache  Physical Exam  There were no vitals taken for this visit. Gen:   Awake, no distress   Resp:  Normal effort  MSK:   Moves extremities without difficulty, strength 5/5 in all 4 extremities, reports normal sensation diffusely, cranial nerves II through XII intact, normal speech, normal gait   Medical Decision Making  Medically screening exam initiated at 3:29 AM.  Appropriate orders placed.  Kerry Rhodes was informed that the remainder of the evaluation will be completed by another provider, this initial triage assessment does not replace that evaluation, and the importance of remaining in the ED until their evaluation is complete.  Patient here with episode of right arm numbness, difficulty speaking that she states lasted 1 to 2 minutes.  Now back to her neurologic baseline.  No focal neurologic deficits on exam.  Labs, urine, head CT pending.   Kerry Rhodes, Layla Maw, DO 12/06/20 0330

## 2020-12-06 NOTE — ED Provider Notes (Signed)
[facility] Emergency Department Provider Note  ____________________________________________   Event Date/Time   First MD Initiated Contact with Patient 12/06/20 [phone]     (approximate)  I have reviewed the triage vital signs and the nursing notes.   HISTORY  Chief Complaint Numbness    HPI Kerry Rhodes is a 25 y.o. female with no significant past medical history who presents to the emergency department with right arm numbness that started at 2 AM this morning.  States she had not yet gone to sleep.  States it involve the entire arm.  Lasted for 1 to 2 minutes and has now resolved.  Triage nurse reports patient was having some difficulty speaking but was able to answer questions slowly and was texting her answers on her phone to show to the nurse.  On my evaluation, all symptoms have resolved.  No headaches, head injury, blood thinner use.  No history of CVA, MS.  No fever.  No cough, nausea, vomiting, diarrhea.        Past Medical History:  Diagnosis Date  . Anemia    during 3rd trimester   . Medical history non-contributory     Patient Active Problem List   Diagnosis Date Noted  . Labor and delivery, indication for care 05/15/2016  . Iron deficiency anemia 04/16/2016    History reviewed. No pertinent surgical history.  Prior to Admission medications   Medication Sig Start Date End Date Taking? Authorizing Provider  ferrous sulfate 325 (65 FE) MG tablet Take 1 tablet (325 mg total) by mouth daily. 12/06/20 12/06/21 Yes Amir Fick N, DO  benzocaine-Menthol (DERMOPLAST) 20-0.5 % AERO Apply 1 application topically 3 (three) times daily. 05/16/16   Schermerhorn, Ihor Austin, MD  ferrous fumarate (HEMOCYTE - 106 MG FE) 325 (106 Fe) MG TABS tablet Take 1 tablet by mouth 3 (three) times daily.    [provider]  ibuprofen (ADVIL,MOTRIN) 600 MG tablet Take 1 tablet (600 mg total) by mouth every 6 (six) hours. 05/16/16   Schermerhorn, Ihor Austin,  MD  Prenatal Vit-Fe Fumarate-FA (PRENATAL VITAMIN PO) Take 1 tablet by mouth daily.    [provider]    Allergies Patient has no known allergies.  No family history on file.  Social History Social History   Tobacco Use  . Smoking status: Never Smoker  . Smokeless tobacco: Never Used    Review of Systems Constitutional: No fever. Eyes: No visual changes. ENT: No sore throat. Cardiovascular: Denies chest pain. Respiratory: Denies shortness of breath. Gastrointestinal: No nausea, vomiting, diarrhea. Genitourinary: Negative for dysuria. Musculoskeletal: Negative for back pain. Skin: Negative for rash. Neurological: Negative for focal weakness. + Right upper extremity numbness.  ____________________________________________   PHYSICAL EXAM:  VITAL SIGNS: ED Triage Vitals  Enc Vitals Group     BP 12/06/20 0331 (!) 148/101     Pulse Rate 12/06/20 0331 62     Resp 12/06/20 0331 18     Temp 12/06/20 0331 99 F (37.2 C)     Temp src --      SpO2 12/06/20 0331 100 %     Weight 12/06/20 0329 210 lb (95.3 kg)     Height 12/06/20 0329 5\' 2"  (1.575 m)     Head Circumference --      Peak Flow --      Pain Score 12/06/20 0329 0     Pain Loc --      Pain Edu? --      Excl. in  GC? --    CONSTITUTIONAL: Alert and oriented and responds appropriately to questions. Well-appearing; well-nourished HEAD: Normocephalic EYES: Conjunctivae clear, pupils appear equal, EOM appear intact ENT: normal nose; moist mucous membranes NECK: Supple, normal ROM CARD: RRR; S1 and S2 appreciated; no murmurs, no clicks, no rubs, no gallops RESP: Normal chest excursion without splinting or tachypnea; breath sounds clear and equal bilaterally; no wheezes, no rhonchi, no rales, no hypoxia or respiratory distress, speaking full sentences ABD/GI: Normal bowel sounds; non-distended; soft, non-tender, no rebound, no guarding, no peritoneal signs, no hepatosplenomegaly BACK: The back appears  normal EXT: Normal ROM in all joints; no deformity noted, no edema; no cyanosis, no calf tenderness or calf swelling, 2+ radial pulses bilaterally SKIN: Normal color for age and race; warm; no rash on exposed skin NEURO: Moves all extremities equally, strength 5/5 in all 4 extremities, cranial nerves II to XII intact, normal speech, normal gait, normal sensation diffusely PSYCH: The patient's mood and manner are appropriate.  ____________________________________________   LABS (all labs ordered are listed, but only abnormal results are displayed)  Labs Reviewed  CBC WITH DIFFERENTIAL/PLATELET - Abnormal; Notable for the following components:      Result Value   Hemoglobin 7.8 (*)    HCT 28.2 (*)    MCV 64.8 (*)    MCH 17.9 (*)    MCHC 27.7 (*)    RDW 18.9 (*)    Platelets 492 (*)    All other components within normal limits  BASIC METABOLIC PANEL - Abnormal; Notable for the following components:   Potassium 3.3 (*)    Glucose, Bld 111 (*)    Calcium 8.7 (*)    All other components within normal limits  POC URINE PREG, ED   ____________________________________________  EKG   EKG Interpretation  Date/Time:  Wednesday Dec 06 2020 03:37:59 EDT Ventricular Rate:  55 PR Interval:  162 QRS Duration: 94 QT Interval:  394 QTC Calculation: 376 R Axis:   3 Text Interpretation: Sinus bradycardia Low voltage QRS Incomplete right bundle branch block Cannot rule out Anterior infarct , age undetermined Abnormal ECG Confirmed by Rochele RaringWard, Toriann Spadoni [phone]([id number]) on 12/06/2020 5:32:46 AM       ____________________________________________  RADIOLOGY Normajean BaxterI, Analisa Sledd, personally viewed and evaluated these images (plain radiographs) as part of my medical decision making, as well as reviewing the written report by the radiologist.  ED MD interpretation: Head CT normal.  Official radiology report(s): CT Head Wo Contrast  Result Date: 12/06/2020 CLINICAL DATA:  Neuro deficit. Stroke suspected. t  states her right arm went numb around aprox 0200 in the morning. Pt denies headache. Pt states the numbness lasted aprox 1-2 minutes. Pt is able to move all extremities, pt is able to text with both hands in triage. EXAM: CT HEAD WITHOUT CONTRAST TECHNIQUE: Contiguous axial images were obtained from the base of the skull through the vertex without intravenous contrast. COMPARISON:  None. FINDINGS: Brain: No evidence of large-territorial acute infarction. No parenchymal hemorrhage. No mass lesion. No extra-axial collection. No mass effect or midline shift. No hydrocephalus. Basilar cisterns are patent. Vascular: No hyperdense vessel. Skull: No acute fracture or focal lesion. Sinuses/Orbits: Paranasal sinuses and mastoid air cells are clear. The orbits are unremarkable. Other: None. IMPRESSION: No acute intracranial abnormality. Electronically Signed   By: Tish FredericksonMorgane  Naveau M.D.   On: 12/06/2020 04:01    ____________________________________________   PROCEDURES  Procedure(s) performed (including Critical Care):  Procedures   ____________________________________________   INITIAL IMPRESSION / ASSESSMENT  AND PLAN / ED COURSE  As part of my medical decision making, I reviewed the following data within the electronic MEDICAL RECORD NUMBER Nursing notes reviewed and incorporated, Labs reviewed , EKG interpreted , Old chart reviewed, Radiograph reviewed  and Notes from prior ED visits         Patient here with brief episode of right arm numbness.  Nursing staff reports patient seemed to be having a hard time talking but was able to answer questions and was able to use her phone to text her responses and showed them to the nurse.  Now neurologically intact.  No risk factors for CVA.  Low suspicion for TIA, CVA today.  Doubt MS.  Doubt intracranial hemorrhage.  Could have been a complicated migraine.  No headache currently.  Will obtain labs to evaluate for possible anemia, electrolyte derangement.  Will  check urine pregnancy.  Will obtain head CT.  ED PROGRESS  Patient continues to be neurologically intact with complaints of some tingling over her right second and third digits.  CT head unremarkable.  Recommended outpatient neurology follow-up.  Patient also has a hemoglobin of 7.8.  She does not know what her hemoglobin normally runs and we do not have any labs for comparison since 2017.  She does report heavy irregular menstrual cycles.  No bloody stools or melena.  I did recommend that she establish care with a primary care physician to have this followed.  She has no chest pain, shortness of breath, dizziness.  She is hemodynamically stable and actually slightly hypertensive.  I do not feel she needs a blood transfusion at this time.  We will start her on ferrous sulfate.  Given outpatient PCP follow-up information.  Potassium level slightly low.  Given oral replacement here in the ED.  EKG shows no interval abnormalities.   At this time, I do not feel there is any life-threatening condition present. I have reviewed, interpreted and discussed all results (EKG, imaging, lab, urine as appropriate) and exam findings with patient/family. I have reviewed nursing notes and appropriate previous records.  I feel the patient is safe to be discharged home without further emergent workup and can continue workup as an outpatient as needed. Discussed usual and customary return precautions. Patient/family verbalize understanding and are comfortable with this plan.  Outpatient follow-up has been provided as needed. All questions have been answered.  ____________________________________________   FINAL CLINICAL IMPRESSION(S) / ED DIAGNOSES  Final diagnoses:  Right arm numbness  Anemia, unspecified type     ED Discharge Orders         Ordered    ferrous sulfate 325 (65 FE) MG tablet  Daily        12/06/20 0559          *Please note:  Maymie Brunke was evaluated in Emergency Department on  12/06/2020 for the symptoms described in the history of present illness. She was evaluated in the context of the global COVID-19 pandemic, which necessitated consideration that the patient might be at risk for infection with the SARS-CoV-2 virus that causes COVID-19. Institutional protocols and algorithms that pertain to the evaluation of patients at risk for COVID-19 are in a state of rapid change based on information released by regulatory bodies including the CDC and federal and state organizations. These policies and algorithms were followed during the patient's care in the ED.  Some ED evaluations and interventions may be delayed as a result of limited staffing during and the pandemic.*   Note:  This document was prepared using Dragon voice recognition software and may include unintentional dictation errors.   Kaetlyn Noa, Layla Maw, DO 12/06/20 [phone]

## 2020-12-06 NOTE — ED Triage Notes (Addendum)
Pt states her right arm went numb around aprox 0200 in the morning. Pt denies headache. Pt states the numbness lasted aprox 1-2 minutes. Pt is able to move all extremities, pt is able to text with both hands in triage. Pts face is symmetrical, grip strength equal. No neuro deficits at this time

## 2021-01-09 ENCOUNTER — Encounter: Payer: Self-pay | Admitting: Oncology

## 2021-03-01 ENCOUNTER — Ambulatory Visit: Payer: Self-pay | Admitting: Neurology

## 2021-06-05 ENCOUNTER — Ambulatory Visit: Payer: Self-pay | Admitting: Neurology

## 2021-08-08 ENCOUNTER — Ambulatory Visit (INDEPENDENT_AMBULATORY_CARE_PROVIDER_SITE_OTHER): Payer: Self-pay | Admitting: Neurology

## 2021-08-08 ENCOUNTER — Encounter: Payer: Self-pay | Admitting: Oncology

## 2021-08-08 ENCOUNTER — Other Ambulatory Visit: Payer: Self-pay

## 2021-08-08 ENCOUNTER — Encounter: Payer: Self-pay | Admitting: Neurology

## 2021-08-08 VITALS — BP 116/82 | HR 61 | Ht 62.0 in | Wt 199.0 lb

## 2021-08-08 DIAGNOSIS — R29818 Other symptoms and signs involving the nervous system: Secondary | ICD-10-CM

## 2021-08-08 DIAGNOSIS — R479 Unspecified speech disturbances: Secondary | ICD-10-CM

## 2021-08-08 DIAGNOSIS — R200 Anesthesia of skin: Secondary | ICD-10-CM

## 2021-08-08 NOTE — Progress Notes (Signed)
[facility]  PATIENT: Kerry Rhodes DOB: [birth year]  REFERRING DOCTOR OR PCP: None SOURCE: Emergency room note, CT and lab reports, CT scan personally reviewed  _________________________________   HISTORICAL  CHIEF COMPLAINT:  Chief Complaint  Patient presents with   New Patient (Initial Visit)    Rm 1, alone. ED referral for numbness/tingling. Seen at the ED on 12/06/20 for R arm numbness and R side of face numbness. For a week pt was not able to speech properly. Words were getting mixed and stuttering. Pt is currently doing well and has not had these sx occur since.     HISTORY OF PRESENT ILLNESS:  I had the pleasure of seeing your patient, Kerry Rhodes, at [facility] for neurologic consultation regarding her episode of numbness and speech disturbance.  She is a 26 year old woman who experienced right arm and right face numbness 12/06/2020.   Symptoms quickly built up over a few minutes while she was laying down on her left side.  The numbness lasted 5-10 minutes and then resolved completely.      She described  a tingling sensation that was not painful.      She had no weakness.    She had no headache.   No change in vision.   At same time, she had the onset of speech difficulty.   Specifically, she was stuttering and words seemed slow and mixed up some.    The speech difficulties lasted a few days and then completely resolved.      She went to the ED and had a CT.   Images were reviewed.   THe CT was normal.   She did not have an MRI.      Since that week in May 2022, she has not had any further symptoms.  Specifically, no episodes of numbness, weakness, visual change, ataxia.  She is otherwise fairly healthy and does not have vascular risk factors.   REVIEW OF SYSTEMS: Constitutional: No fevers, chills, sweats, or change in appetite Eyes: No visual changes, double vision, eye pain Ear, nose and throat: No hearing loss, ear pain,  nasal congestion, sore throat Cardiovascular: No chest pain, palpitations Respiratory:  No shortness of breath at rest or with exertion.   No wheezes GastrointestinaI: No nausea, vomiting, diarrhea, abdominal pain, fecal incontinence Genitourinary:  No dysuria, urinary retention or frequency.  No nocturia. Musculoskeletal:  No neck pain, back pain Integumentary: No rash, pruritus, skin lesions Neurological: as above Psychiatric: No depression at this time.  No anxiety Endocrine: No palpitations, diaphoresis, change in appetite, change in weigh or increased thirst Hematologic/Lymphatic:  No anemia, purpura, petechiae. Allergic/Immunologic: No itchy/runny eyes, nasal congestion, recent allergic reactions, rashes  ALLERGIES: No Known Allergies  HOME MEDICATIONS:  Current Outpatient Medications:    benzocaine-Menthol (DERMOPLAST) 20-0.5 % AERO, Apply 1 application topically 3 (three) times daily., Disp: 1 each, Rfl: 1   ferrous fumarate (HEMOCYTE - 106 MG FE) 325 (106 Fe) MG TABS tablet, Take 1 tablet by mouth 3 (three) times daily., Disp: , Rfl:    ferrous sulfate 325 (65 FE) MG tablet, Take 1 tablet (325 mg total) by mouth daily., Disp: 30 tablet, Rfl: 3   ibuprofen (ADVIL,MOTRIN) 600 MG tablet, Take 1 tablet (600 mg total) by mouth every 6 (six) hours., Disp: 30 tablet, Rfl: 0   Prenatal Vit-Fe Fumarate-FA (PRENATAL VITAMIN PO), Take 1 tablet by mouth daily., Disp: , Rfl:   PAST MEDICAL HISTORY: Past Medical History:  Diagnosis  Date   Anemia    during 3rd trimester    Medical history non-contributory     PAST SURGICAL HISTORY: History reviewed. No pertinent surgical history.  FAMILY HISTORY: Family History  Problem Relation Age of Onset   Cancer Mother    Thyroid disease Sister    Thyroid disease Sister    Cancer Maternal Grandfather    Thyroid disease Paternal Grandmother     SOCIAL HISTORY:  Social History   Socioeconomic History   Marital status: Significant Other     Spouse name: Not on file   Number of children: 2   Years of education: Not on file   Highest education level: GED or equivalent  Occupational History   Not on file  Tobacco Use   Smoking status: Never   Smokeless tobacco: Never  Substance and Sexual Activity   Alcohol use: Never   Drug use: Not on file   Sexual activity: Never  Other Topics Concern   Not on file  Social History Narrative   Lives at home with BF and children   Right handed   Caffeine: 4 sodas in a week   Social Determinants of Health   Financial Resource Strain: Not on file  Food Insecurity: Not on file  Transportation Needs: Not on file  Physical Activity: Not on file  Stress: Not on file  Social Connections: Not on file  Intimate Partner Violence: Not on file     PHYSICAL EXAM  Vitals:   08/08/21 1018  BP: 116/82  Pulse: 61  Weight: 199 lb (90.3 kg)  Height: 5\' 2"  (1.575 m)    Body mass index is 36.4 kg/m.   General: The patient is well-developed and well-nourished and in no acute distress  HEENT:  Head is [place]/AT.  Sclera are anicteric.  Funduscopic exam shows normal optic discs and retinal vessels.  Neck: No carotid bruits are noted.  The neck is nontender.  Cardiovascular: The heart has a regular rate and rhythm with a normal S1 and S2. There were no murmurs, gallops or rubs.    Skin: Extremities are without rash or  edema.  Musculoskeletal:  Back is nontender  Neurologic Exam  Mental status: The patient is alert and oriented x 3 at the time of the examination. The patient has apparent normal recent and remote memory, with an apparently normal attention span and concentration ability.   Speech is normal.  Cranial nerves: Extraocular movements are full. Pupils are equal, round, and reactive to light and accomodation.  Visual fields are full.  Facial symmetry is present. There is good facial sensation to soft touch bilaterally.Facial strength is normal.  Trapezius and  sternocleidomastoid strength is normal. No dysarthria is noted.  The tongue is midline, and the patient has symmetric elevation of the soft palate. No obvious hearing deficits are noted.  Motor:  Muscle bulk is normal.   Tone is normal. Strength is  5 / 5 in all 4 extremities.   Sensory: Sensory testing is intact to pinprick, soft touch and vibration sensation in all 4 extremities.  Coordination: Cerebellar testing reveals good finger-nose-finger and heel-to-shin bilaterally.  Gait and station: Station is normal.   Gait is normal. Tandem gait is normal. Romberg is negative.   Reflexes: Deep tendon reflexes are symmetric and normal bilaterally.   Plantar responses are flexor.    DIAGNOSTIC DATA (LABS, IMAGING, TESTING) - I reviewed patient records, labs, notes, testing and imaging myself where available.  Lab Results  Component Value Date  WBC 9.0 12/06/2020   HGB 7.8 (L) 12/06/2020   HCT 28.2 (L) 12/06/2020   MCV 64.8 (L) 12/06/2020   PLT 492 (H) 12/06/2020      Component Value Date/Time   NA 137 12/06/2020 0333   K 3.3 (L) 12/06/2020 0333   CL 105 12/06/2020 0333   CO2 24 12/06/2020 0333   GLUCOSE 111 (H) 12/06/2020 0333   BUN 7 12/06/2020 0333   CREATININE 0.48 12/06/2020 0333   CALCIUM 8.7 (L) 12/06/2020 0333   GFRNONAA >60 12/06/2020 0333       ASSESSMENT AND PLAN  Transient neurologic deficit - Plan: US Carotid Bilateral  Right sided numbness - Plan: US Carotid Bilateral  Speech disturbance, unspecified type - Plan: US Carotid Bilateral   In summary, Kerry Rhodes is a 26 year old woman with a 5-minute episode of right-sided numbness associated with speech disturbance.  The etiology is uncertain.  If she was older I be more concerned about a transient ischemic attack but she has no vascular risk factors.  I still feel we need to check a carotid Doppler to make sure that there is not another risk factor (i.e. severe fibromuscular dysplasia that might cause  carotid stenosis).    The episode was not followed by a headache so is unlikely to represent an unusual migraine aura.  It is possible that she had nerve root impingement due to sustained positioning of her neck while laying on her side.  Most likely, we would not be able to discover the etiology as symptoms were short-lived.  We will let her know the results of the carotid Doppler study.  If she continues to have the spells I will want to check an MRI of the brain as well.  She will follow-up as needed and let us know if she has new or worsening neurologic symptoms.   Brittnei Jagiello A. Epimenio Foot, MD, [facility] 08/08/2021, 11:07 AM Certified in Neurology, Clinical Neurophysiology, Sleep Medicine and Neuroimaging  [facility] [address] [place], [place] [address] [phone]

## 2021-08-24 ENCOUNTER — Encounter: Payer: Self-pay | Admitting: Oncology

## 2021-08-24 ENCOUNTER — Ambulatory Visit
Admission: RE | Admit: 2021-08-24 | Discharge: 2021-08-24 | Disposition: A | Discharge status: Home / Self Care | Payer: No Typology Code available for payment source | Source: Ambulatory Visit | Attending: Neurology | Admitting: Neurology

## 2021-08-24 DIAGNOSIS — R29818 Other symptoms and signs involving the nervous system: Secondary | ICD-10-CM

## 2021-08-24 DIAGNOSIS — R479 Unspecified speech disturbances: Secondary | ICD-10-CM

## 2021-08-24 DIAGNOSIS — R200 Anesthesia of skin: Secondary | ICD-10-CM

## 2021-08-27 ENCOUNTER — Telehealth: Payer: Self-pay

## 2021-08-27 NOTE — Telephone Encounter (Signed)
I called patient. I advised her of her normal carotid doppler. Pt verbalized understanding of results. Pt had no questions at this time but was encouraged to call back if questions arise.

## 2021-08-27 NOTE — Telephone Encounter (Signed)
-----   Message from Asa Lente, MD sent at 08/25/2021  8:44 AM EST ----- Please let her know the carotid doppler was normal

## 2023-03-10 IMAGING — CT CT HEAD W/O CM
3 series · 16 of 47 positions shown, 19 images · non-contrast
Comparison: None.

CLINICAL DATA: Neuro deficit. Stroke suspected. t states her right
arm went numb around aprox 6966 in the morning. Pt denies headache.
Pt states the numbness lasted aprox 1-2 minutes. Pt is able to move
all extremities, pt is able to text with both hands in triage.

EXAM:
CT HEAD WITHOUT CONTRAST
TECHNIQUE: Contiguous axial images were obtained from the base of the skull
through the vertex without intravenous contrast.

[Series 3: head wo · axial · 0.44mm/px · z∈[-152,-27]mm · 10 of 31 slices shown, 13 images]
[im 3/31  brain]
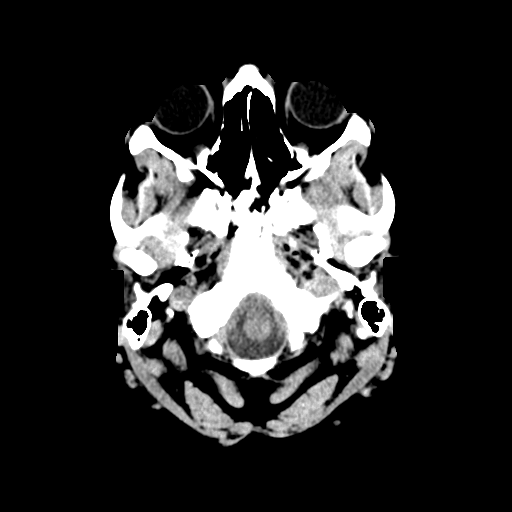
[im 3/31  bone]
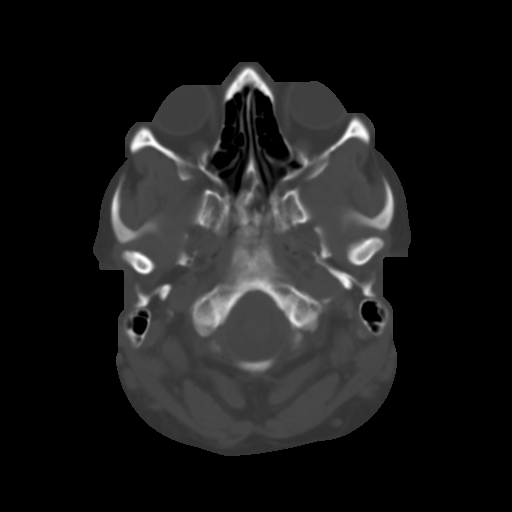
[im 6/31  brain]
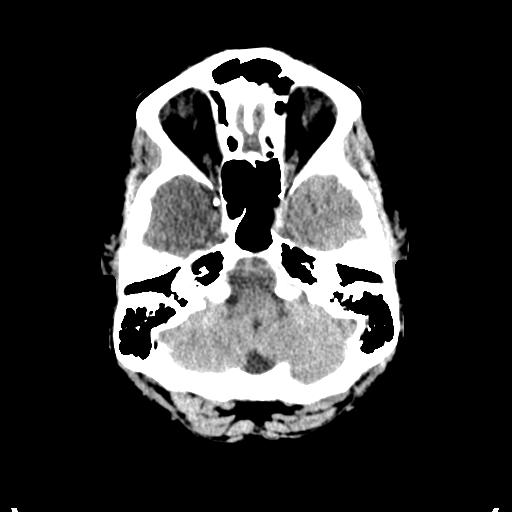
[im 9/31  brain]
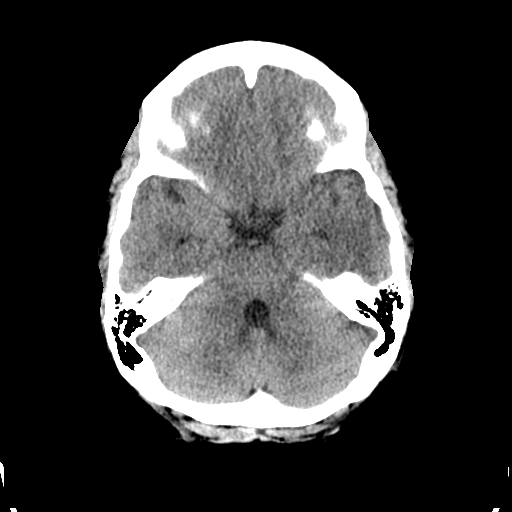
[im 11/31  brain]
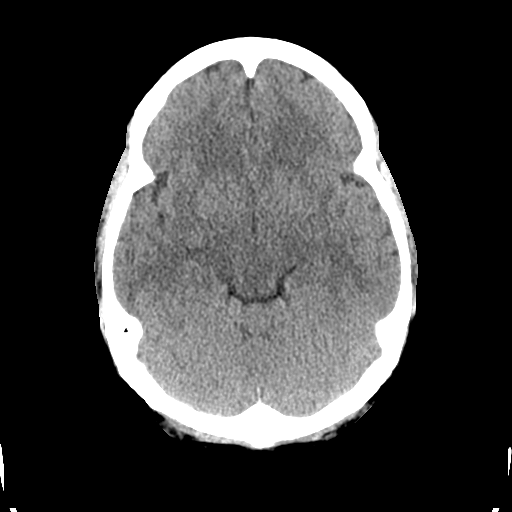
[im 14/31  brain]
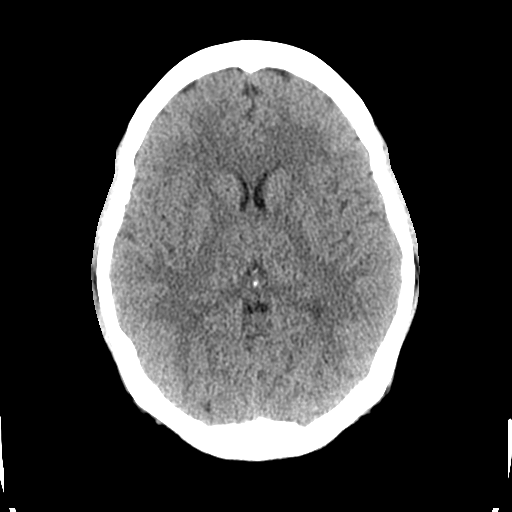
[im 14/31  bone]
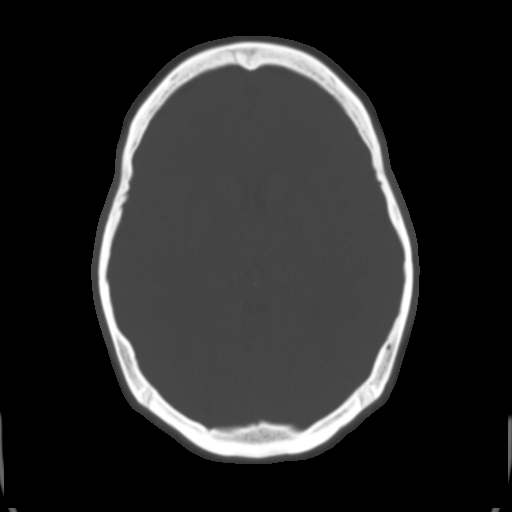
[im 17/31  brain]
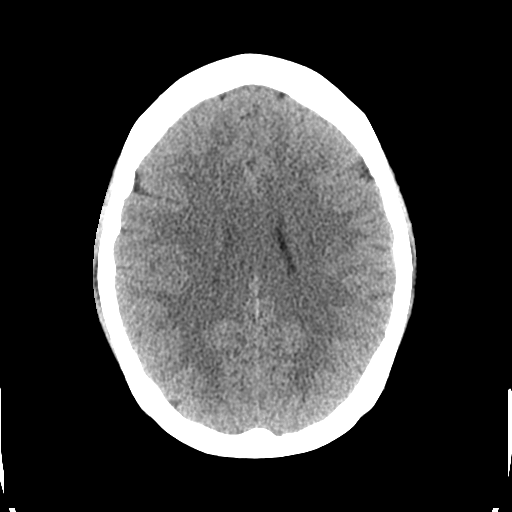
[im 20/31  brain]
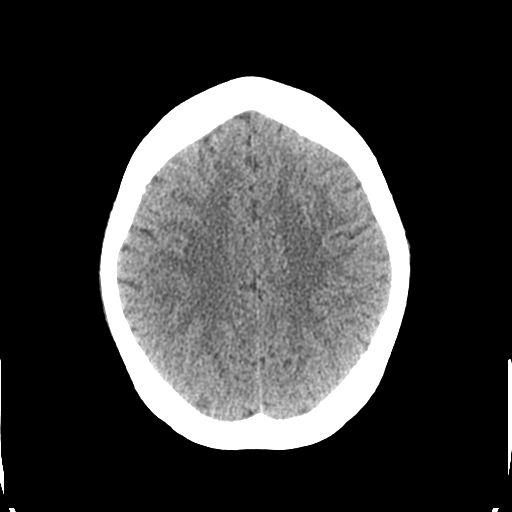
[im 23/31  brain]
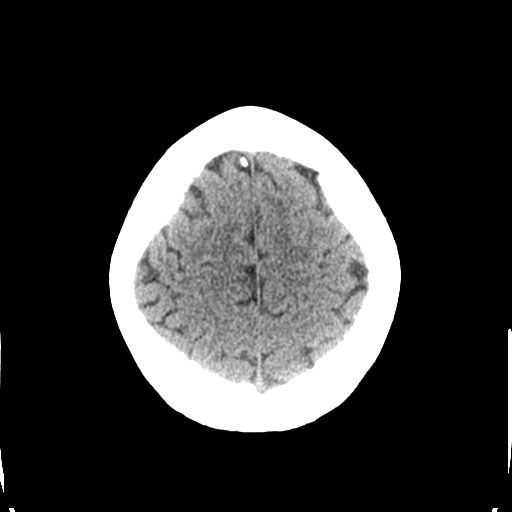
[im 25/31  brain]
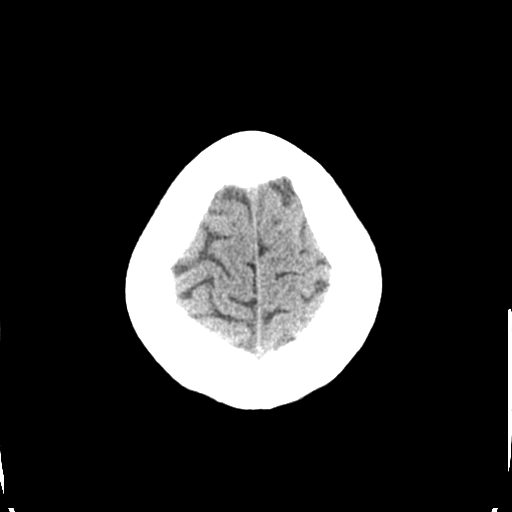
[im 25/31  bone]
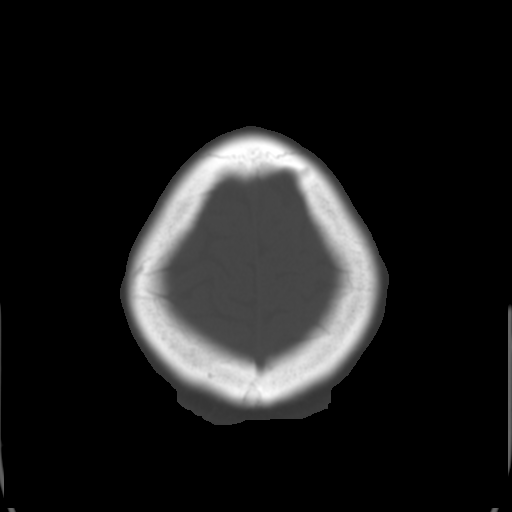
[im 28/31  brain]
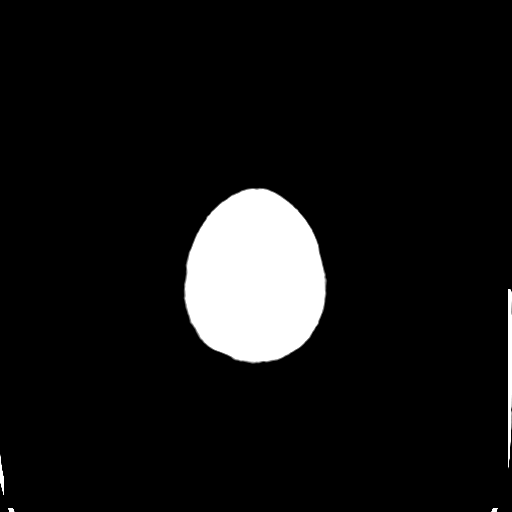

[Series 4: coronal soft tissue · coronal · 0.33mm/px · 3 of 66 slices shown]
[im 22/66  brain]
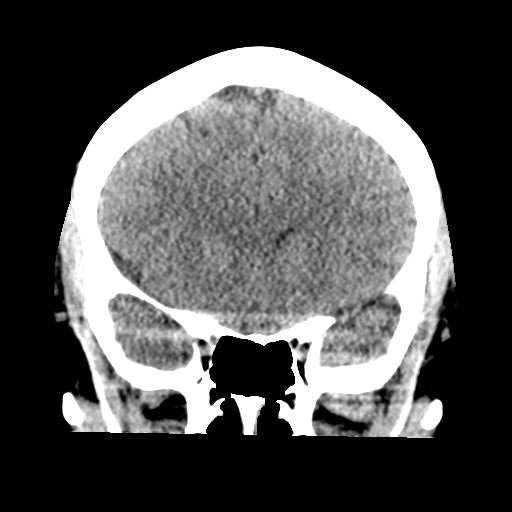
[im 29/66  brain]
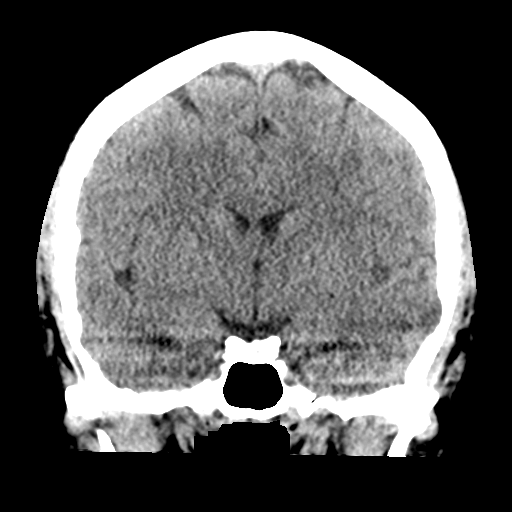
[im 37/66  brain]
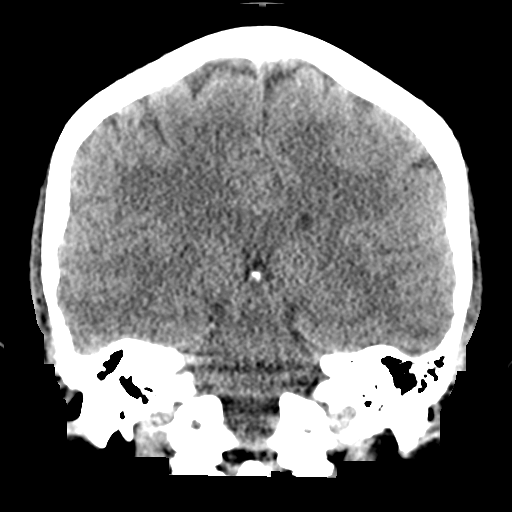

[Series 5: sagittal soft tissue · sagittal · 0.33mm/px · 3 of 56 slices shown]
[im 19/56  brain]
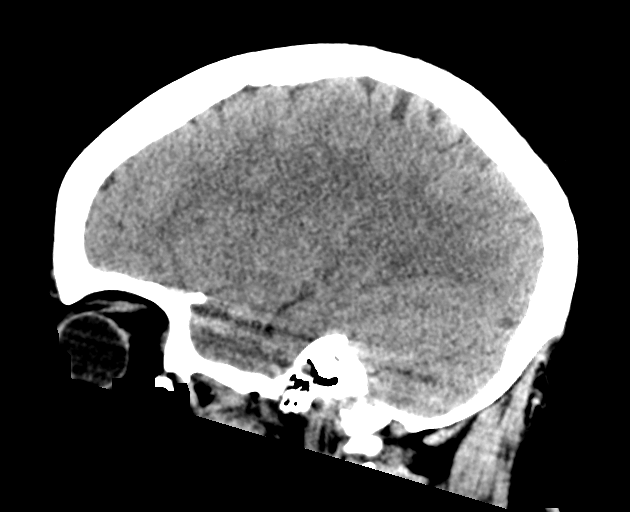
[im 28/56  brain]
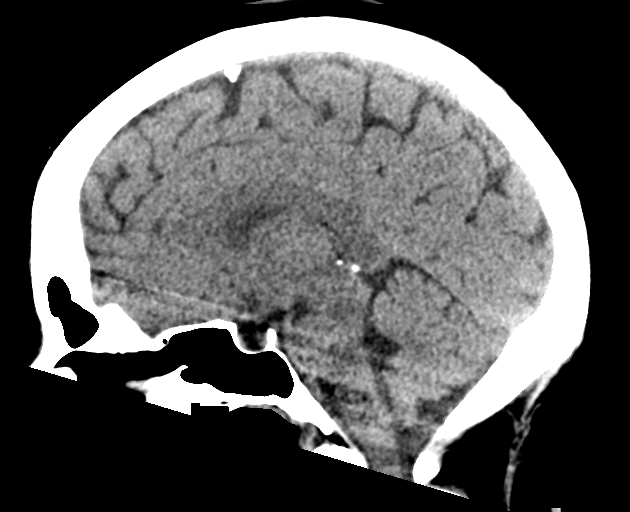
[im 37/56  brain]
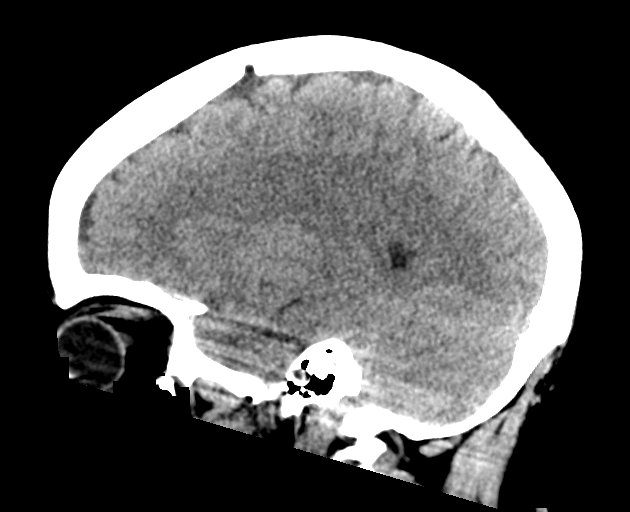

[16 of 47 positions shown; findings below may reference images not displayed]

FINDINGS: Brain:

No evidence of large-territorial acute infarction. No parenchymal
hemorrhage. No mass lesion. No extra-axial collection.

No mass effect or midline shift. No hydrocephalus. Basilar cisterns
are patent.

Vascular: No hyperdense vessel.

Skull: No acute fracture or focal lesion.

Sinuses/Orbits: Paranasal sinuses and mastoid air cells are clear.
The orbits are unremarkable.

Other: None.
IMPRESSION: No acute intracranial abnormality.

## 2023-11-26 IMAGING — US US CAROTID DUPLEX BILAT
1 series · 13 of 24 positions shown · non-contrast
Comparison: None.

CLINICAL DATA: 25-year-old female with transient neurologic
symptoms

EXAM:
BILATERAL CAROTID DUPLEX ULTRASOUND
TECHNIQUE: Gray scale imaging, color Doppler and duplex ultrasound were
performed of bilateral carotid and vertebral arteries in the neck.

[Series 1: us carotid duplex bilat · 0.07mm/px · 13 of 52 slices shown]
[im 1/52]
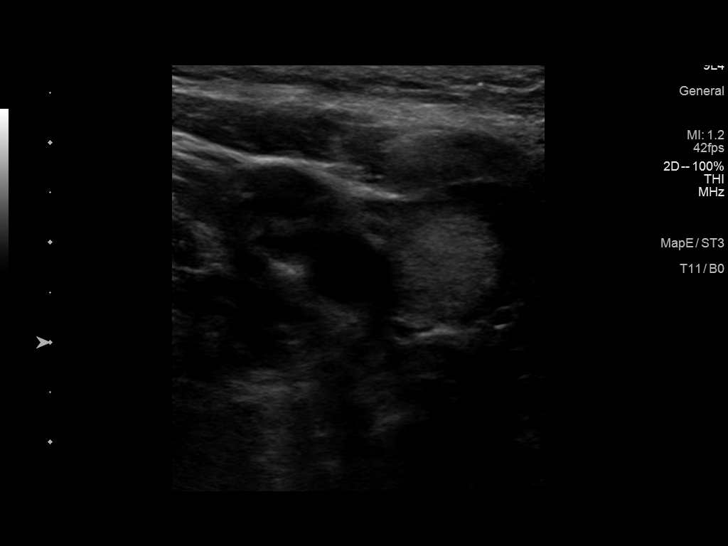
[im 5/52]
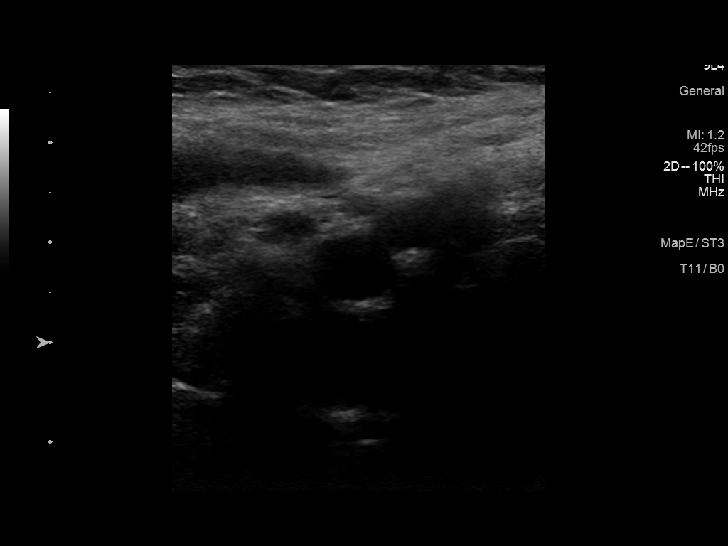
[im 9/52]
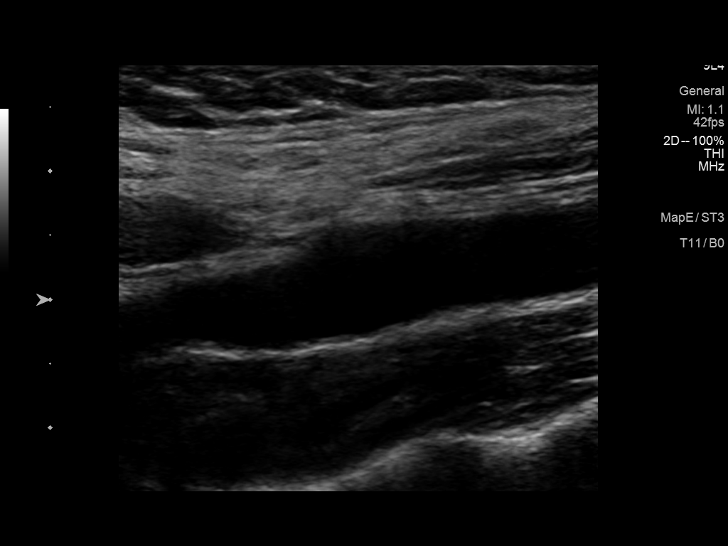
[im 14/52]
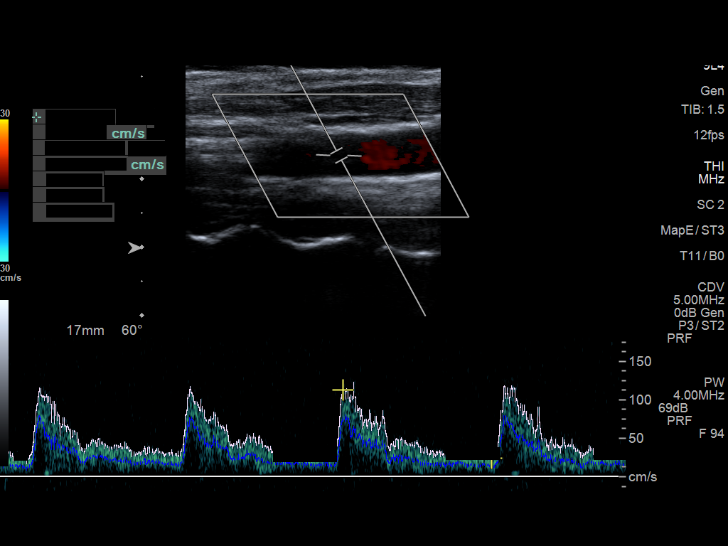
[im 18/52]
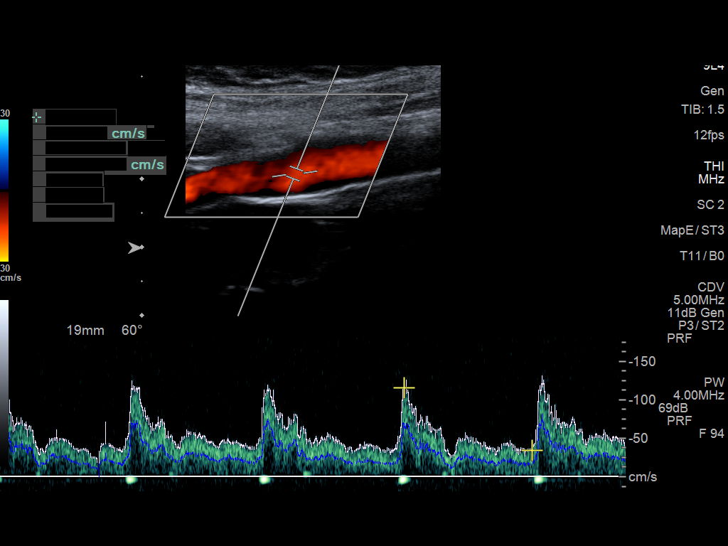
[im 23/52]
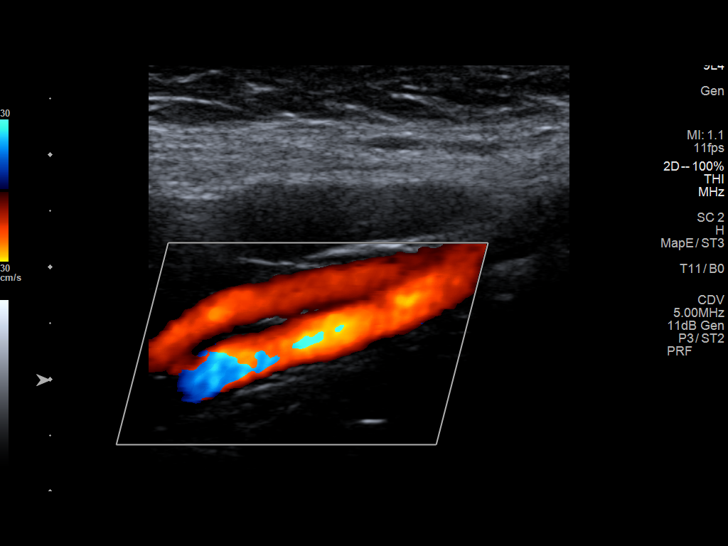
[im 27/52]
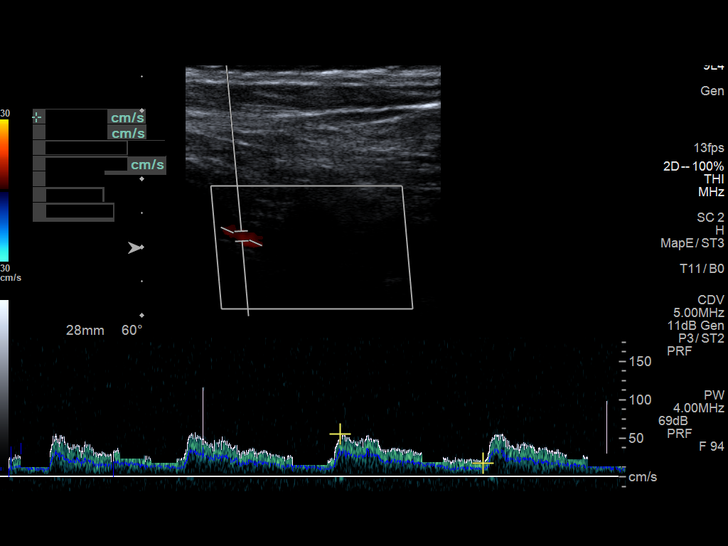
[im 29/52]
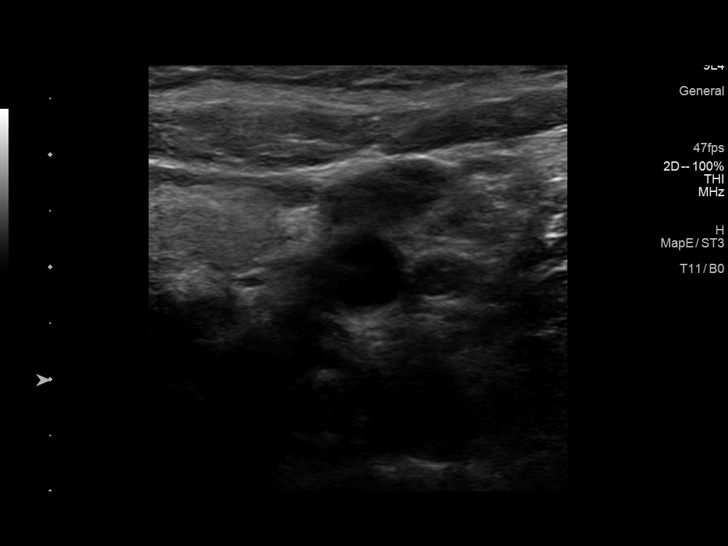
[im 34/52]
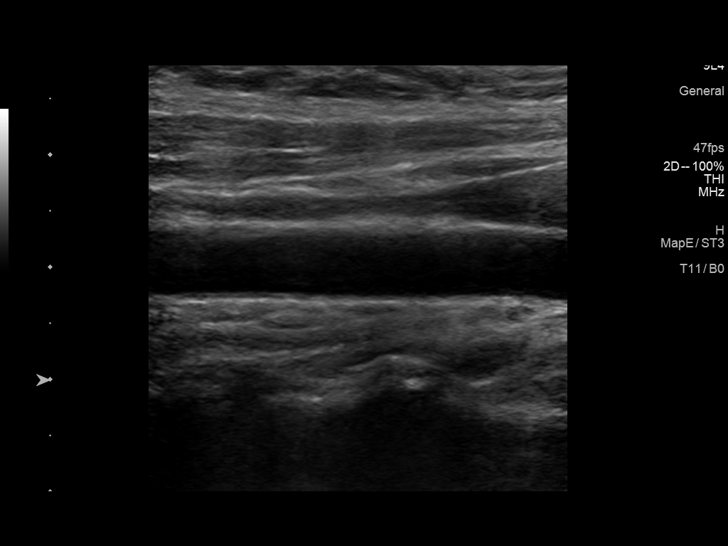
[im 38/52]
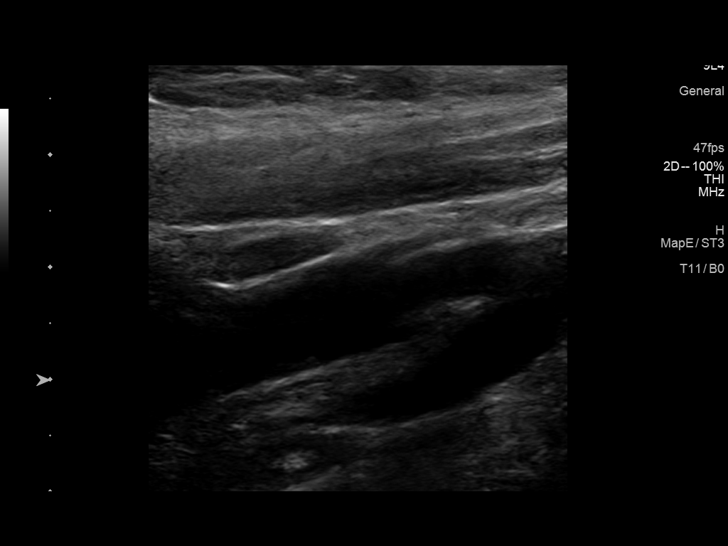
[im 43/52]
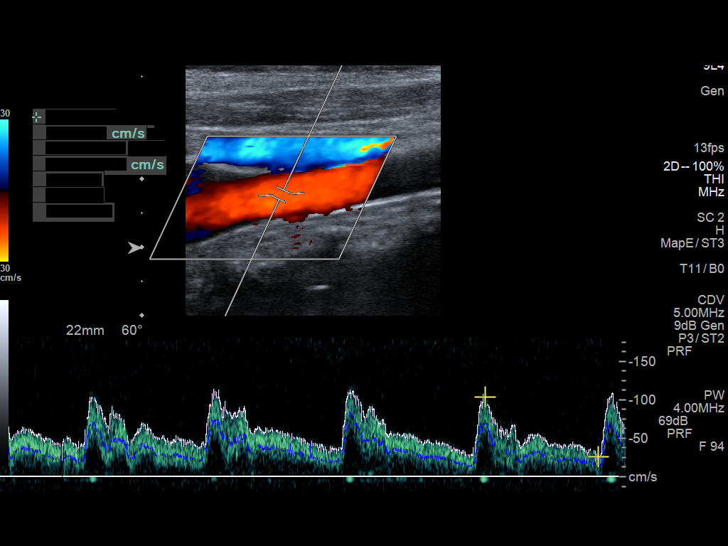
[im 47/52]
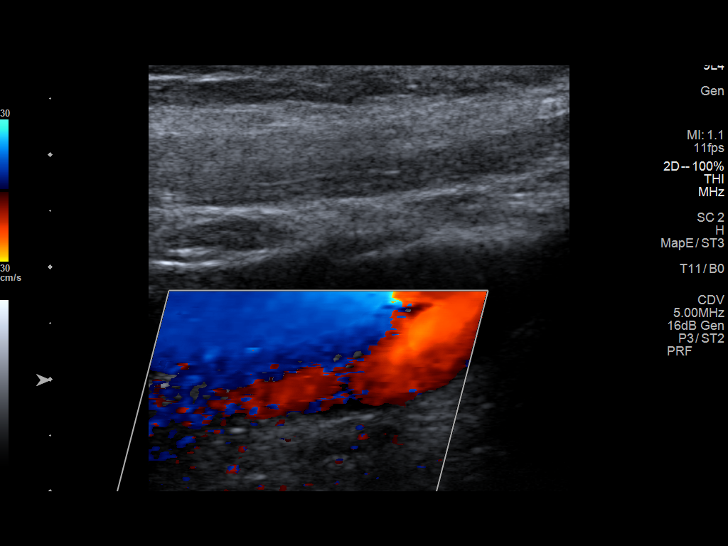
[im 52/52]
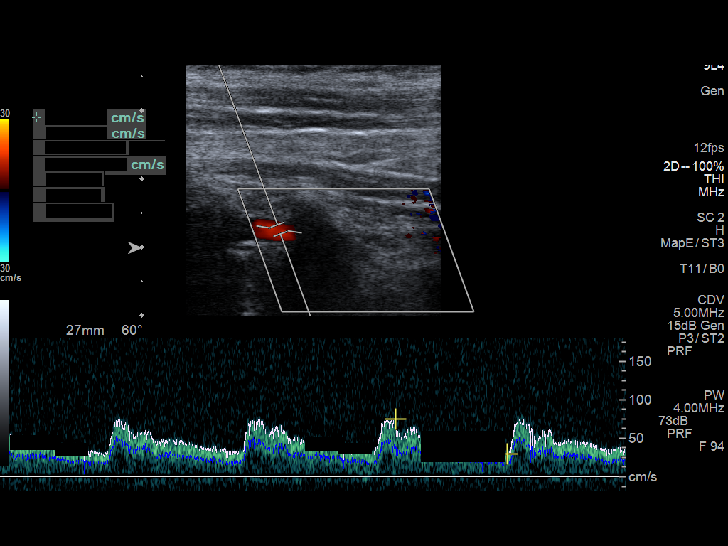

[13 of 24 positions shown; findings below may reference images not displayed]

FINDINGS: Criteria: Quantification of carotid stenosis is based on velocity
parameters that correlate the residual internal carotid diameter
with NASCET-based stenosis levels, using the diameter of the distal
internal carotid lumen as the denominator for stenosis measurement.

The following velocity measurements were obtained:

RIGHT

ICA:  Systolic 130 cm/sec, Diastolic 54 cm/sec

CCA:  124 cm/sec

SYSTOLIC ICA/CCA RATIO:

ECA:  110 cm/sec

LEFT

ICA:  Systolic 112 cm/sec, Diastolic 44 cm/sec

CCA:  116 cm/sec

SYSTOLIC ICA/CCA RATIO:

ECA:  98 cm/sec

Right Brachial SBP: 126

Left Brachial SBP: 130

RIGHT CAROTID ARTERY: Unremarkable appearance of the carotid system
without significant atherosclerotic disease. Intermediate waveform
of the CCA. Low resistance waveform of the ICA. No significant
tortuosity of the carotid system.

RIGHT VERTEBRAL ARTERY: Antegrade flow with low resistance waveform.

LEFT CAROTID ARTERY: Unremarkable appearance of the carotid system
without significant atherosclerotic disease. Intermediate waveform
of the CCA. Low resistance waveform of the ICA. No significant
tortuosity of the carotid system.

LEFT VERTEBRAL ARTERY:  Antegrade flow with low resistance waveform.
IMPRESSION: Color duplex indicates no significant plaque, with no
hemodynamically significant stenosis by duplex criteria in the
extracranial cerebrovascular circulation.
